# Patient Record
Sex: Female | Born: 2000
Health system: Southern US, Community
[De-identification: ages and names within clinical notes are randomized; demographics above are authoritative.]

## PROBLEM LIST (undated history)

## (undated) DIAGNOSIS — Z9189 Other specified personal risk factors, not elsewhere classified: Secondary | ICD-10-CM

## (undated) DIAGNOSIS — R519 Headache, unspecified: Secondary | ICD-10-CM

## (undated) DIAGNOSIS — T7840XA Allergy, unspecified, initial encounter: Secondary | ICD-10-CM

## (undated) DIAGNOSIS — G43909 Migraine, unspecified, not intractable, without status migrainosus: Secondary | ICD-10-CM

## (undated) DIAGNOSIS — R55 Syncope and collapse: Secondary | ICD-10-CM

## (undated) DIAGNOSIS — R51 Headache: Secondary | ICD-10-CM

## (undated) HISTORY — DX: Syncope and collapse: R55

## (undated) HISTORY — PX: TYMPANOSTOMY TUBE PLACEMENT: SHX32

## (undated) HISTORY — DX: Headache, unspecified: R51.9

## (undated) HISTORY — DX: Other specified personal risk factors, not elsewhere classified: Z91.89

## (undated) HISTORY — DX: Allergy, unspecified, initial encounter: T78.40XA

## (undated) HISTORY — DX: Migraine, unspecified, not intractable, without status migrainosus: G43.909

---
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
## 2018-02-24 ENCOUNTER — Ambulatory Visit: Payer: BLUE CROSS/BLUE SHIELD | Admitting: Neurology

## 2018-03-23 ENCOUNTER — Encounter: Payer: Self-pay | Admitting: Neurology

## 2018-04-13 ENCOUNTER — Ambulatory Visit: Payer: BLUE CROSS/BLUE SHIELD | Admitting: Neurology

## 2018-04-30 ENCOUNTER — Ambulatory Visit: Payer: BLUE CROSS/BLUE SHIELD | Admitting: Neurology

## 2018-05-01 ENCOUNTER — Ambulatory Visit: Payer: BLUE CROSS/BLUE SHIELD | Admitting: Neurology

## 2018-06-10 ENCOUNTER — Ambulatory Visit: Payer: BLUE CROSS/BLUE SHIELD | Admitting: Neurology

## 2018-06-11 ENCOUNTER — Other Ambulatory Visit: Payer: Self-pay

## 2018-06-11 ENCOUNTER — Encounter: Payer: Self-pay | Admitting: Neurology

## 2018-06-11 ENCOUNTER — Ambulatory Visit (INDEPENDENT_AMBULATORY_CARE_PROVIDER_SITE_OTHER): Payer: BLUE CROSS/BLUE SHIELD | Admitting: Neurology

## 2018-06-11 VITALS — BP 94/60 | HR 92 | Ht 60.0 in | Wt 95.0 lb

## 2018-06-11 DIAGNOSIS — M542 Cervicalgia: Secondary | ICD-10-CM

## 2018-06-11 DIAGNOSIS — G43009 Migraine without aura, not intractable, without status migrainosus: Secondary | ICD-10-CM | POA: Diagnosis not present

## 2018-06-11 MED ORDER — TIZANIDINE HCL 2 MG PO TABS: 2.0000 mg | ORAL_TABLET | Freq: Every evening | ORAL | 2 refills | Status: DC | PRN

## 2018-06-11 MED ORDER — RIZATRIPTAN BENZOATE 10 MG PO TABS: ORAL_TABLET | ORAL | 2 refills | Status: DC

## 2018-06-11 NOTE — Patient Instructions (Signed)
Migraine Recommendations: 1.  Continue nortriptyline 10mg  at bedtime.  2.  STOP SUMATRIPTAN.  Instead, take rizatriptan 10mg  at earliest onset of headache.  May repeat dose once in 2 hours if needed.  Do not exceed two tablets in 24 hours. 3.  Limit use of pain relievers to no more than 2 days out of the week.  These medications include acetaminophen, ibuprofen, triptans and narcotics.  This will help reduce risk of rebound headaches. 4.  Be aware of common food triggers such as processed sweets, processed foods with nitrites (such as deli meat, hot dogs, sausages), foods with MSG, alcohol (such as wine), chocolate, certain cheeses, certain fruits (dried fruits, bananas, some citrus fruit), vinegar, diet soda. 4.  Avoid caffeine 5.  Routine exercise 6.  Proper sleep hygiene 7.  Stay adequately hydrated with water 8.  Keep a headache diary. 9.  Maintain proper stress management. 10.  Do not skip meals. 11.  Consider supplements:  Magnesium citrate 400mg  to 600mg  daily, riboflavin 400mg , Coenzyme Q 10 100mg  three times daily 12.  FOR NECK PAIN, TAKE TIZANIDINE 2MG  AT BEDTIME.  MAY TAKE 4MG  IF NEEDED.  CAUTION FOR DROWSINESS 13.  Let me know if you wish for me to refer you to physical therapy or doctor of osteopathic medical doctor at [facility] 14.  Follow up in 6 months.

## 2018-06-11 NOTE — Progress Notes (Signed)
NEUROLOGY FOLLOW UP OFFICE NOTE  Tiffany Barnett [id number]  HISTORY OF PRESENT ILLNESS: Tiffany Barnett is an 17 year old female with neurocardiogenic syncope who follows up for migraines.  She is accompanied by her mother who supplements history.   UPDATE: Overall, headache frequency is low.  In April, she had a two week intractable migraine.  No known triggers.  She was not stressed as it was in the middle of semester so no finals.  May have been weather.  Also, she is allergic to dogs.  Her family recently got a dog, so she had a migraine for a week, likely related to the pet dander.  She reports left sided neck pain and believes it may be contributing to her migraines.  Otherwise, migraines are: Intensity:  5/10 Duration:  4 hours with sumatriptan 50 mg.  Sumatriptan still causes stomach pain). Frequency:  3 to 4 days a month. Frequency of pain relievers:  3 to 4 days a month. Current NSAIDS:  no Current analgesics:  no Current triptans:  sumatriptan 50mg  Current anti-emetic:  no Current muscle relaxants:  no Current anti-anxiolytic:  no Current sleep aide:  no Current Antihypertensive medications:  no Current Antidepressant medications:  nortriptyline 10mg  Current Anticonvulsant medications:  no Current Vitamins/Herbal/Supplements:  no Current Antihistamines/Decongestants:  Xyzal Other therapy:  no OCP:  Loryna   Caffeine:  no Alcohol:  no Smoker:  no Diet:  Does not drink enough water Exercise:  no Depression/anxiety:  no Sleep hygiene:  Poor.  Goes to sleep at 2-3 AM due to not feeling tired.  Does not nap.  More tired because she now has to wake up for work for 8 AM during the holiday and will be having an 8 AM class next semester.   HISTORY: Onset:  17 years old.  Started to increase at age 10. Location:  Unilateral retro-orbital, either side Quality:  Pounding/stabbing/pressure Initial intensity:  5/10 Aura:  no Prodrome:  no Postdrome:  no Associated  symptoms:  Sometimes photophobia.  No nausea, vomiting, phonophobia, osmophobia or visual disturbance.  She has not had any new worse headache of her life, waking up from sleep Initial Duration:  3 to 4 hours (sometimes 2 hours with sumatriptan 25mg ) Initial Frequency:  5 days per month Initial Frequency of abortive medication: 5 days per month Triggers/exacerbating factors:  unknown Relieving factors:  unknown Activity:  Aggravates but still functions.   Past NSAIDS:  Excedrin, ibuprofen Past analgesics:  Tylenol, Excedrin Past abortive triptans:  no Past muscle relaxants:  no Past anti-emetic:  no Past antihypertensive medications:  no Past antidepressant medications:  no Past anticonvulsant medications:  no Past vitamins/Herbal/Supplements:  no Other past therapies:  no   Since childhood, she has had recurrent syncope which has been diagnosed as neurocardiogenic syncope.  She will feel lightheaded with dimming of vision, diaphoresis and palpitations.  She passes out for just a few seconds.  There is no postictal confusion.  It occurs spontaneously and not triggered by change in position or exertion (however one event was associated with injuring her finger).  It first occurred at age 75.  It occurred again at age 11 and then at age 64.  However, it has been occurring more frequent since age 35.  She had an episode last November and another episode in July.  She would experience a headache about 2 days prior to the syncope.  She does not have headache with the syncope.  There is a question about whether the  syncope is related to her migraines.     Family history of headache:  mom   CT of head from 06/03/17 was personally reviewed and revealed no mass lesion or acute intracranial abnormality.  PAST MEDICAL HISTORY: Past Medical History:  Diagnosis Date  . Headache   . Syncope   . Vasovagal syncope     MEDICATIONS: Current Outpatient Medications on File Prior to Visit  Medication Sig  Dispense Refill  . levocetirizine (XYZAL) 5 MG tablet Take 5 mg by mouth every evening.  4  . LORYNA 3-0.02 MG tablet Take 1 tablet by mouth every evening.   6  . nortriptyline (PAMELOR) 10 MG capsule Take 1 capsule (10 mg total) by mouth at bedtime. 30 capsule 5   No current facility-administered medications on file prior to visit.     ALLERGIES: No Known Allergies  FAMILY HISTORY: Family History  Problem Relation Age of Onset  . Migraines Mother   . Anemia Mother   . Migraines Father     SOCIAL HISTORY: Social History   Socioeconomic History  . Marital status: Single    Spouse name: Not on file  . Number of children: Not on file  . Years of education: Not on file  . Highest education level: Not on file  Occupational History  . Not on file  Social Needs  . Financial resource strain: Not on file  . Food insecurity:    Worry: Not on file    Inability: Not on file  . Transportation needs:    Medical: Not on file    Non-medical: Not on file  Tobacco Use  . Smoking status: Never Smoker  . Smokeless tobacco: Never Used  Substance and Sexual Activity  . Alcohol use: No  . Drug use: No  . Sexual activity: Not on file  Lifestyle  . Physical activity:    Days per week: Not on file    Minutes per session: Not on file  . Stress: Not on file  Relationships  . Social connections:    Talks on phone: Not on file    Gets together: Not on file    Attends religious service: Not on file    Active member of club or organization: Not on file    Attends meetings of clubs or organizations: Not on file    Relationship status: Not on file  . Intimate partner violence:    Fear of current or ex partner: Not on file    Emotionally abused: Not on file    Physically abused: Not on file    Forced sexual activity: Not on file  Other Topics Concern  . Not on file  Social History Narrative  . Not on file    REVIEW OF SYSTEMS: Constitutional: No fevers, chills, or sweats, no  generalized fatigue, change in appetite Eyes: No visual changes, double vision, eye pain Ear, nose and throat: No hearing loss, ear pain, nasal congestion, sore throat Cardiovascular: No chest pain, palpitations Respiratory:  No shortness of breath at rest or with exertion, wheezes GastrointestinaI: No nausea, vomiting, diarrhea, abdominal pain, fecal incontinence Genitourinary:  No dysuria, urinary retention or frequency Musculoskeletal:  Neck pain Integumentary: No rash, pruritus, skin lesions Neurological: as above Psychiatric: No depression, insomnia, anxiety Endocrine: No palpitations, fatigue, diaphoresis, mood swings, change in appetite, change in weight, increased thirst Hematologic/Lymphatic:  No purpura, petechiae. Allergic/Immunologic: no itchy/runny eyes, nasal congestion, recent allergic reactions, rashes  PHYSICAL EXAM: Vitals:   06/11/18 0859  BP: 94/60  Pulse: 92  SpO2: 99%   General: No acute distress.  Patient appears well-groomed.  normal body habitus. Head:  Normocephalic/atraumatic Eyes:  Fundi examined but not visualized Neck: supple, left sided paraspinal tenderness, full range of motion Heart:  Regular rate and rhythm Lungs:  Clear to auscultation bilaterally Back: No paraspinal tenderness Neurological Exam: alert and oriented to person, place, and time. Attention span and concentration intact, recent and remote memory intact, fund of knowledge intact.  Speech fluent and not dysarthric, language intact.  CN II-XII intact. Bulk and tone normal, muscle strength 5/5 throughout.  Sensation to light touch  intact.  Deep tendon reflexes 2+ throughout.  Finger to nose testing intact.  Gait normal, Romberg negative.  IMPRESSION: Migraine without aura, without status migrainosus, not intractable Cervicalgia, likely myofasical  PLAN: 1.  Continue nortriptyline 10mg  at bedtime 2.  Stop sumatriptan.  Try rizatriptan 10mg  instead.  If she has adverse reaction, then we  will try a different class of medication. 3.  Tizanidine 2 to 4mg  at bedtime for neck pain.  She may contact me for referral to physical therapy or to see Sports Medicine for [facility]. 4.  Limit pain relievers to no more than 2 days out of week to prevent rebound headache. 5.  Headache diary 6.  Follow up in 6 months.  Shon Millet, DO  CC: Shirlean Mylar, MD

## 2018-08-10 IMAGING — DX DG CHEST 2V
2 series · 2 of 2 positions shown · non-contrast
Comparison: None.

CLINICAL DATA: Dizziness today, chest tightness.

EXAM:
CHEST  2 VIEW

[w chest pa]
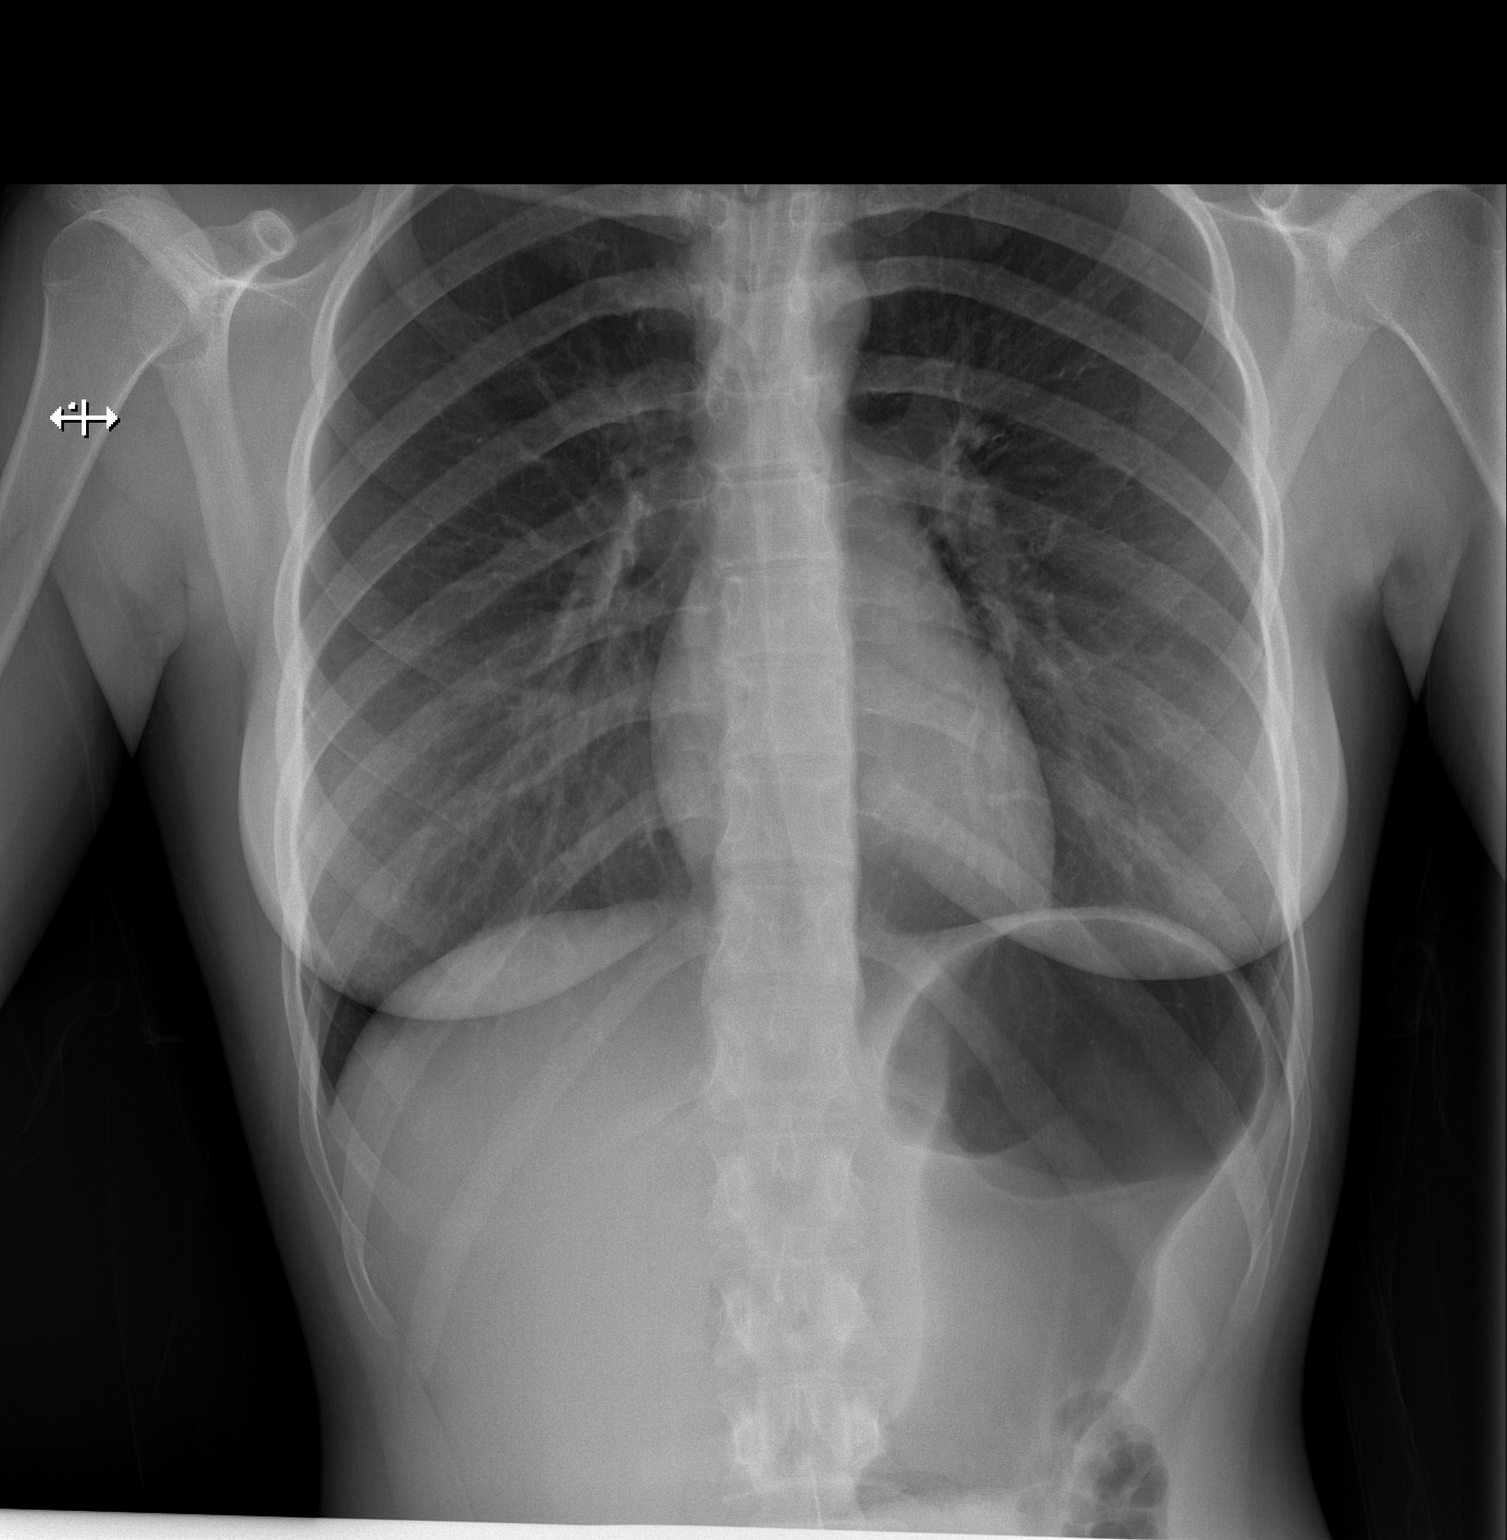

[w chest lat]
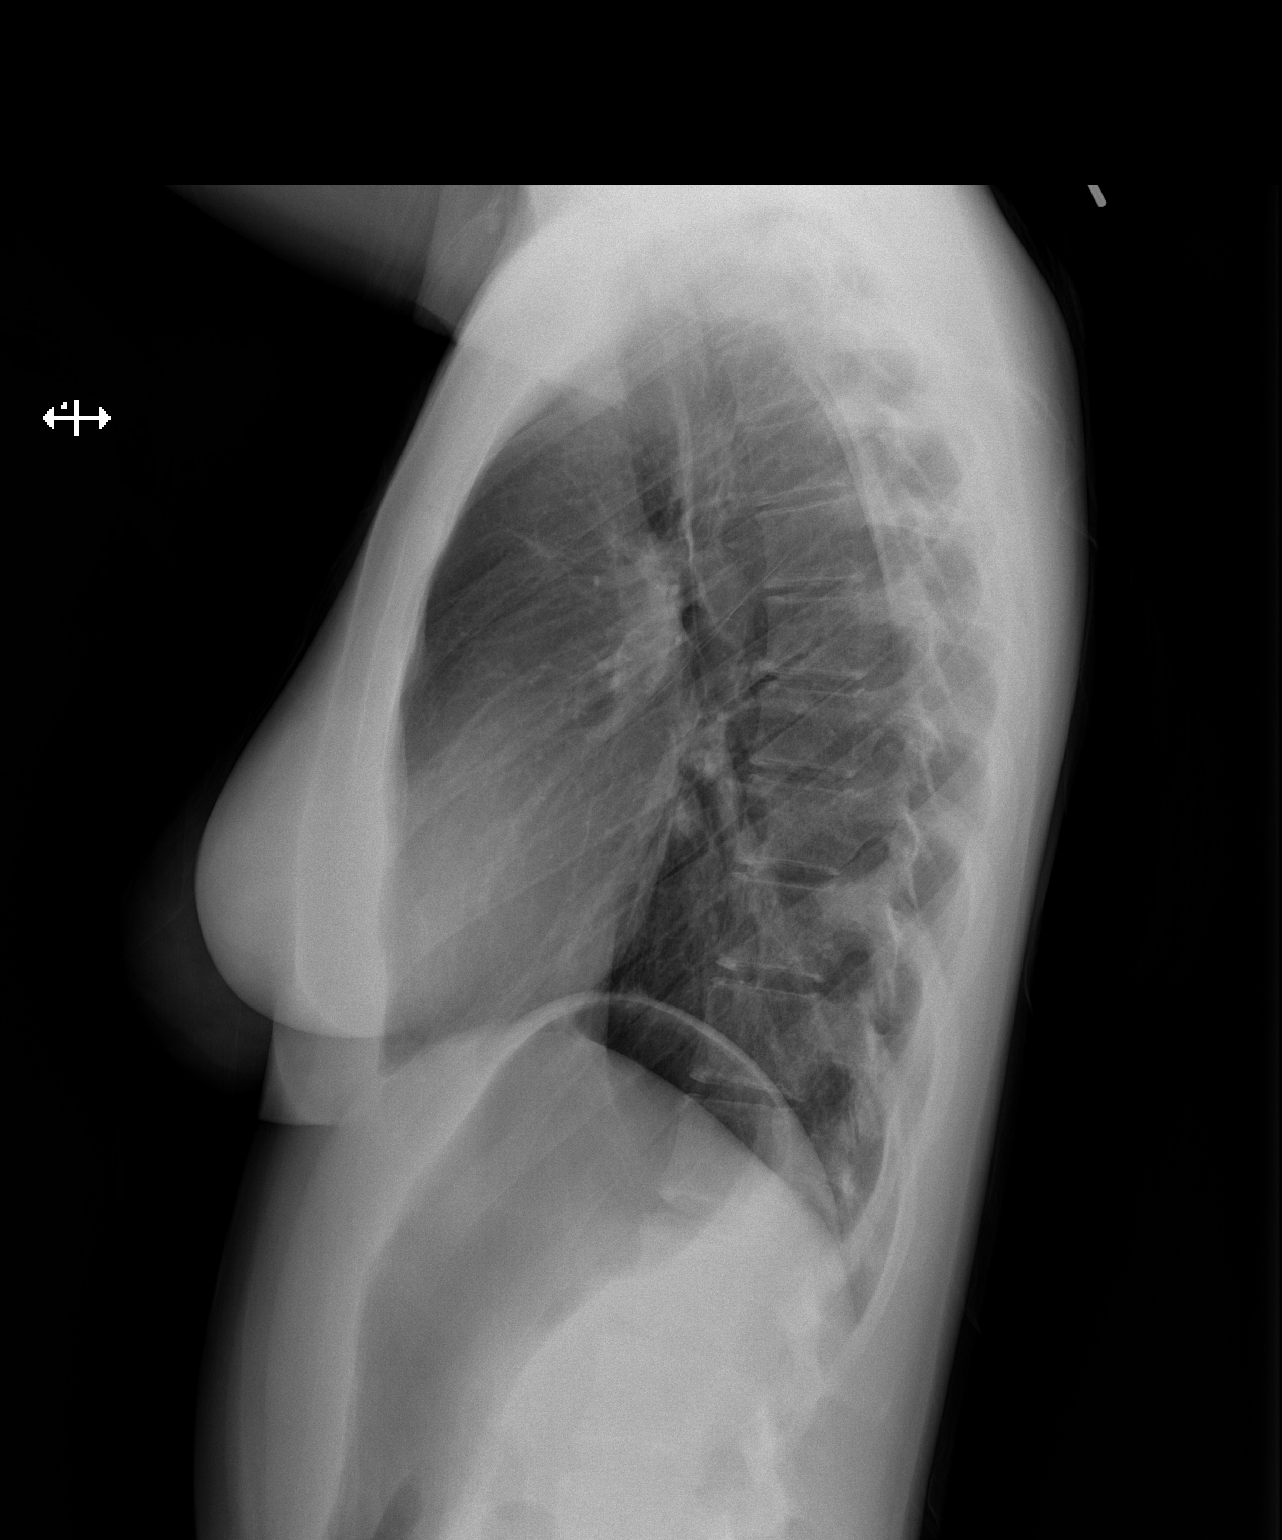

[2 of 2 positions shown; findings below may reference images not displayed]

FINDINGS: The heart size and mediastinal contours are within normal limits.
Both lungs are clear. The visualized skeletal structures are
unremarkable.
IMPRESSION: No active cardiopulmonary disease.

## 2018-08-28 ENCOUNTER — Telehealth: Payer: Self-pay

## 2018-08-28 MED ORDER — NORTRIPTYLINE HCL 25 MG PO CAPS: 25.0000 mg | ORAL_CAPSULE | Freq: Every day | ORAL | 3 refills | Status: DC

## 2018-08-28 NOTE — Telephone Encounter (Signed)
Pt called, she has been having headaches off and on for the last 3 weeks. Pt has been taking nortriptyline 10 mg at bedtime. She has been using rizatriptan, but the headaches have returned. The tizanidine has helped the neck pain.  Would it be appropriate to increase the nortriptyline?

## 2018-08-28 NOTE — Telephone Encounter (Signed)
Called and LMOVM advising Pt. 

## 2018-08-28 NOTE — Telephone Encounter (Signed)
Yes.  Increase nortriptyline to 25mg  at bedtime.  We can increase dose to 50mg  at bedtime in another 4 weeks if needed (she should then contact us).

## 2018-11-25 HISTORY — PX: KNEE SURGERY: SHX244

## 2018-12-13 NOTE — Progress Notes (Signed)
NEUROLOGY FOLLOW UP OFFICE NOTE  Tiffany Barnett [id number]  HISTORY OF PRESENT ILLNESS: Tiffany Barnett is a 18 year old female with neurocardiogenic syncope who follows up for migraines.    UPDATE: Intensity:  6/10 Duration:  1 to 1.5 hours with rizatriptan Frequency:  5 days in past 30 days (4 in past week with change in weather) Frequency of abortive medication: 5 in 30 days Current NSAIDS:  none Current analgesics:  none Current triptans:  Rizatriptan 10mg  Current ergotamine:  none Current anti-emetic:  none Current muscle relaxants:  Tizanidine 2 to 4 mg at bedtime for neck pain Current anti-anxiolytic:  none Current sleep aide:  none Current Antihypertensive medications:  none Current Antidepressant medications:  Nortriptyline 25mg  at bedtime Current Anticonvulsant medications:  none Current anti-CGRP:  none Current Vitamins/Herbal/Supplements:  none Current Antihistamines/Decongestants:  Xyzal Other therapy:  none Hormone/birth control:  Loryna  Caffeine:  no Alcohol:  no Smoker:  no Diet:  Hydrates. Exercise:  Got a new membership for gym. Depression:  no; Anxiety:  bo Other pain:  no Sleep hygiene:  Good.  HISTORY: Onset:  18 years old.  Started to increase at age 55. Location:  Unilateral retro-orbital, either side Quality:  Pounding/stabbing/pressure Initial intensity:  5/10 Aura:  no Prodrome:  no Postdrome:  no Associated symptoms:  Sometimes photophobia.  No nausea, vomiting, phonophobia, osmophobia or visual disturbance.  She has not had any new worse headache of her life, waking up from sleep Initial Duration:  3 to 4 hours (sometimes 2 hours with sumatriptan 25mg ) Initial Frequency:  5 days per month Initial Frequency of abortive medication: 5 days per month Triggers:  Unknown Relieving factors:  unknown Activity:  Aggravates but still functions.  Past NSAIDS:  Excedrin, ibuprofen Past analgesics:  Tylenol, Excedrin Past abortive  triptans:  sumatriptan 50mg  Past muscle relaxants:  no Past anti-emetic:  no Past antihypertensive medications:  no Past antidepressant medications:  no Past anticonvulsant medications:  no Past vitamins/Herbal/Supplements:  no Other past therapies:  no  Since childhood, she has had recurrent syncope which has been diagnosed as neurocardiogenic syncope.  She will feel lightheaded with dimming of vision, diaphoresis and palpitations.  She passes out for just a few seconds.  There is no postictal confusion.  It occurs spontaneously and not triggered by change in position or exertion (however one event was associated with injuring her finger).  It first occurred at age 13.  It occurred again at age 22 and then at age 4.  However, it has been occurring more frequent since age 69.  She had an episode last November and another episode in July.  She would experience a headache about 2 days prior to the syncope.  She does not have headache with the syncope.  There is a question about whether the syncope is related to her migraines.    Family history of headache:  mom  CT of head from 06/03/17 was personally reviewed and revealed no mass lesion or acute intracranial abnormality.  PAST MEDICAL HISTORY: Past Medical History:  Diagnosis Date  . Headache   . Syncope   . Vasovagal syncope     MEDICATIONS: Current Outpatient Medications on File Prior to Visit  Medication Sig Dispense Refill  . doxycycline (VIBRAMYCIN) 100 MG capsule Take by mouth daily.  2  . levocetirizine (XYZAL) 5 MG tablet Take 5 mg by mouth every evening.  4  . LORYNA 3-0.02 MG tablet Take 1 tablet by mouth every evening.   6  .  nortriptyline (PAMELOR) 10 MG capsule Take 1 capsule (10 mg total) by mouth at bedtime. 30 capsule 5  . nortriptyline (PAMELOR) 25 MG capsule Take 1 capsule (25 mg total) by mouth at bedtime. 30 capsule 3  . rizatriptan (MAXALT) 10 MG tablet Take 1 tablet earliest onset of migraine.  May repeat once in  2 hours if needed 10 tablet 2  . tiZANidine (ZANAFLEX) 2 MG tablet Take 1 tablet (2 mg total) by mouth at bedtime as needed for muscle spasms. 30 tablet 2   No current facility-administered medications on file prior to visit.     ALLERGIES: No Known Allergies  FAMILY HISTORY: Family History  Problem Relation Age of Onset  . Migraines Mother   . Anemia Mother   . Migraines Father    SOCIAL HISTORY: Social History   Socioeconomic History  . Marital status: Single    Spouse name: Not on file  . Number of children: Not on file  . Years of education: Not on file  . Highest education level: Not on file  Occupational History  . Not on file  Social Needs  . Financial resource strain: Not on file  . Food insecurity:    Worry: Not on file    Inability: Not on file  . Transportation needs:    Medical: Not on file    Non-medical: Not on file  Tobacco Use  . Smoking status: Never Smoker  . Smokeless tobacco: Never Used  Substance and Sexual Activity  . Alcohol use: No  . Drug use: No  . Sexual activity: Not on file  Lifestyle  . Physical activity:    Days per week: Not on file    Minutes per session: Not on file  . Stress: Not on file  Relationships  . Social connections:    Talks on phone: Not on file    Gets together: Not on file    Attends religious service: Not on file    Active member of club or organization: Not on file    Attends meetings of clubs or organizations: Not on file    Relationship status: Not on file  . Intimate partner violence:    Fear of current or ex partner: Not on file    Emotionally abused: Not on file    Physically abused: Not on file    Forced sexual activity: Not on file  Other Topics Concern  . Not on file  Social History Narrative  . Not on file    REVIEW OF SYSTEMS: Constitutional: No fevers, chills, or sweats, no generalized fatigue, change in appetite Eyes: No visual changes, double vision, eye pain Ear, nose and throat: No  hearing loss, ear pain, nasal congestion, sore throat Cardiovascular: No chest pain, palpitations Respiratory:  No shortness of breath at rest or with exertion, wheezes GastrointestinaI: No nausea, vomiting, diarrhea, abdominal pain, fecal incontinence Genitourinary:  No dysuria, urinary retention or frequency Musculoskeletal:  No neck pain, back pain Integumentary: No rash, pruritus, skin lesions Neurological: as above Psychiatric: No depression, insomnia, anxiety Endocrine: No palpitations, fatigue, diaphoresis, mood swings, change in appetite, change in weight, increased thirst Hematologic/Lymphatic:  No purpura, petechiae. Allergic/Immunologic: no itchy/runny eyes, nasal congestion, recent allergic reactions, rashes  PHYSICAL EXAM: Blood pressure 94/76, pulse 78, height 5' (1.524 m), weight 94 lb (42.6 kg), SpO2 100% General: No acute distress.  Patient appears well-groomed.  normal body habitus. Head:  Normocephalic/atraumatic Eyes:  Fundi examined but not visualized Neck: supple, no paraspinal tenderness, full  range of motion Heart:  Regular rate and rhythm Lungs:  Clear to auscultation bilaterally Back: No paraspinal tenderness Neurological Exam: alert and oriented to person, place, and time. Attention span and concentration intact, recent and remote memory intact, fund of knowledge intact.  Speech fluent and not dysarthric, language intact.  CN II-XII intact. Bulk and tone normal, muscle strength 5/5 throughout.  Sensation to light touch.  Deep tendon reflexes 2+ throughout, toes downgoing.  Finger to nose and heel to shin testing intact.  Gait normal, Romberg negative.  IMPRESSION: 1.  Migraine without aura, without status migrainosus, not intractable 2.  Cervicalgia, likely myofascial  PLAN: 1.  For preventative management, nortriptyline 25mg  at bedtime 2.  For abortive therapy, rizatriptan 10mg  3. Tizanidine at bedtime for neck pain 4.  Limit use of pain relievers to no  more than 2 days out of week to prevent risk of rebound or medication-overuse headache. 5.  Keep headache diary 6.  Exercise, hydration, caffeine cessation, sleep hygiene, monitor for and avoid triggers 7.  Consider:  magnesium citrate 400mg  daily, riboflavin 400mg  daily, and coenzyme Q10 100mg  three times daily 8.   Follow up in 6 months.  Shon Millet, DO  CC:  Shirlean Mylar, MD

## 2018-12-14 ENCOUNTER — Ambulatory Visit (INDEPENDENT_AMBULATORY_CARE_PROVIDER_SITE_OTHER): Payer: BLUE CROSS/BLUE SHIELD | Admitting: Neurology

## 2018-12-14 ENCOUNTER — Encounter: Payer: Self-pay | Admitting: Neurology

## 2018-12-14 VITALS — BP 94/76 | HR 78 | Ht 60.0 in | Wt 94.0 lb

## 2018-12-14 DIAGNOSIS — G43009 Migraine without aura, not intractable, without status migrainosus: Secondary | ICD-10-CM

## 2018-12-14 NOTE — Patient Instructions (Signed)
1.  For preventative management, continue nortriptyline 25mg  at bedtime 2.  For abortive therapy, rizatriptan 10mg  3. Tizanidine at bedtime for neck pain 4.  Limit use of pain relievers to no more than 2 days out of week to prevent risk of rebound or medication-overuse headache. 5.  Keep headache diary 6.  Exercise, hydration, caffeine cessation, sleep hygiene, monitor for and avoid triggers 7.  Consider:  magnesium citrate 400mg  daily, riboflavin 400mg  daily, and coenzyme Q10 100mg  three times daily 8.  Follow up in 6 months

## 2018-12-15 ENCOUNTER — Other Ambulatory Visit: Payer: Self-pay | Admitting: Neurology

## 2019-02-19 ENCOUNTER — Other Ambulatory Visit: Payer: Self-pay | Admitting: Neurology

## 2019-03-13 IMAGING — CT CT HEAD W/O CM
3 of 4 series · 15 of 47 positions shown, 18 images · non-contrast
Comparison: None.

CLINICAL DATA: 16 y/o F; status post fall with head injury and
swelling over the right eye.

EXAM:
CT HEAD WITHOUT CONTRAST
TECHNIQUE: Contiguous axial images were obtained from the base of the skull
through the vertex without intravenous contrast.

[Series 2: head w/o · axial · non-contrast · 0.45mm/px · z∈[-86,+24]mm · 9 of 28 slices shown, 12 images]
[im 3/28  brain]
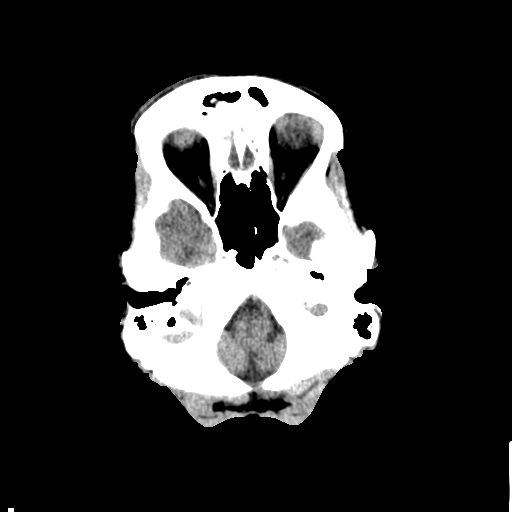
[im 3/28  bone]
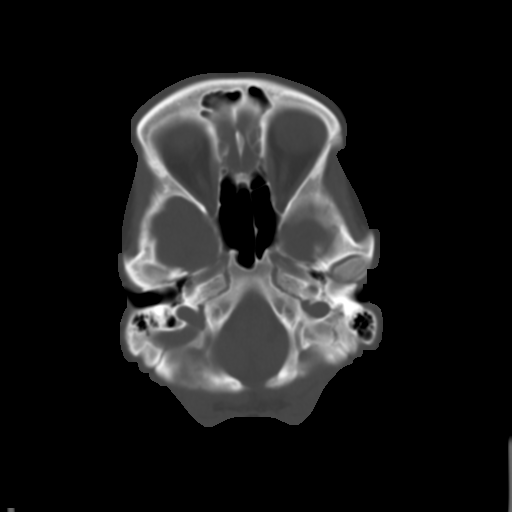
[im 6/28  brain]
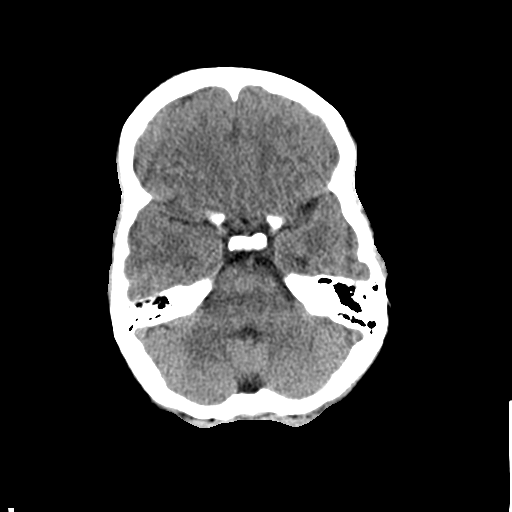
[im 9/28  brain]
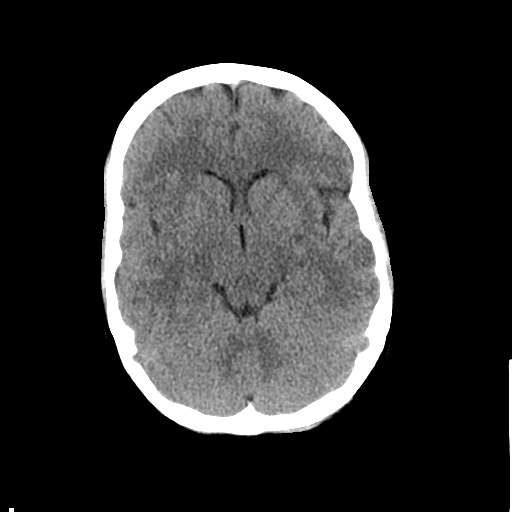
[im 11/28  brain]
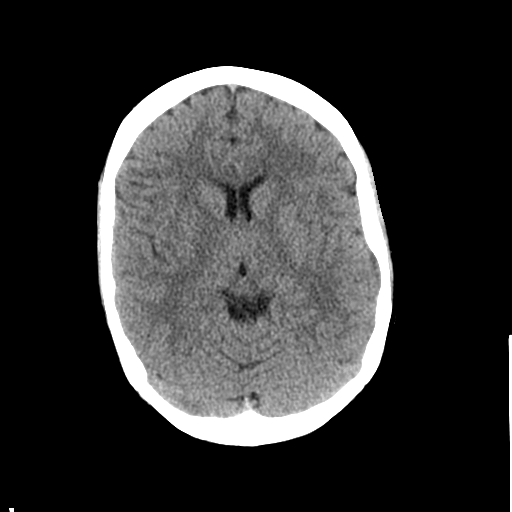
[im 14/28  brain]
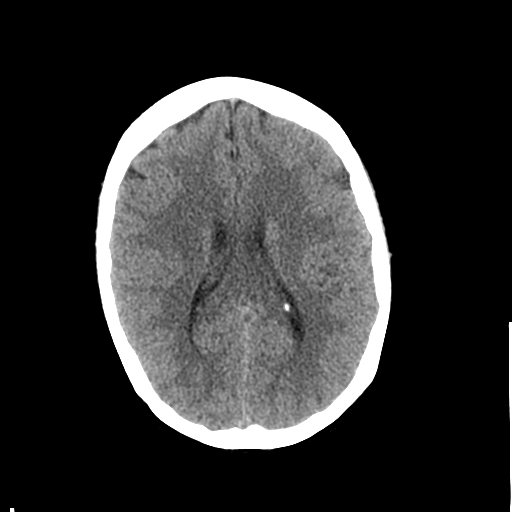
[im 14/28  bone]
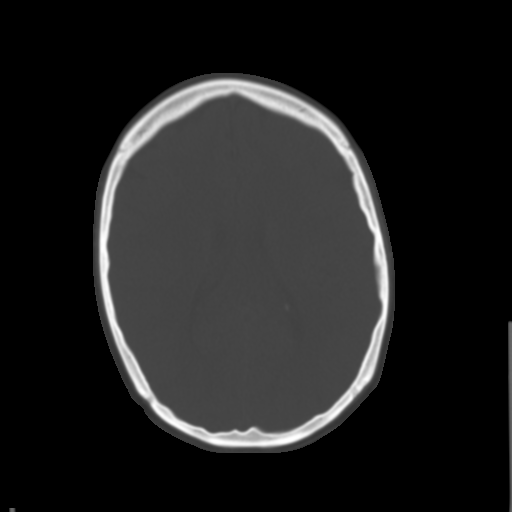
[im 17/28  brain]
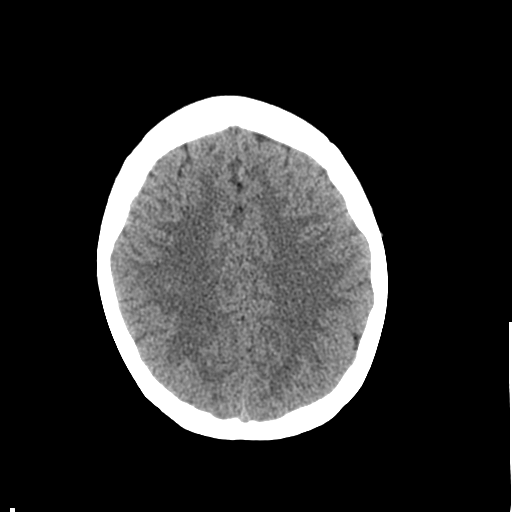
[im 19/28  brain]
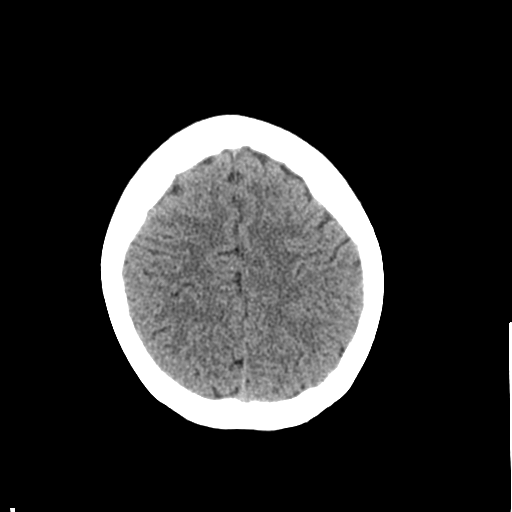
[im 22/28  brain]
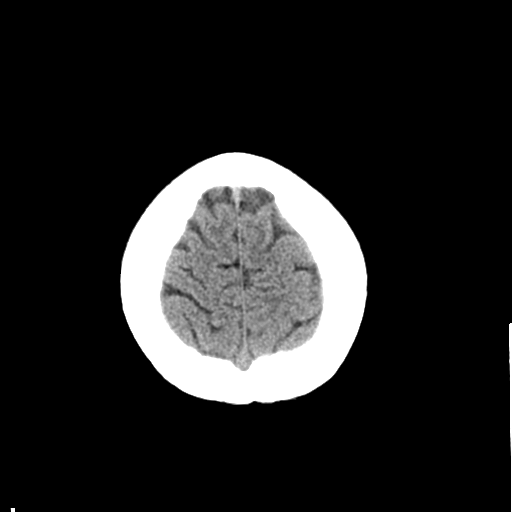
[im 25/28  brain]
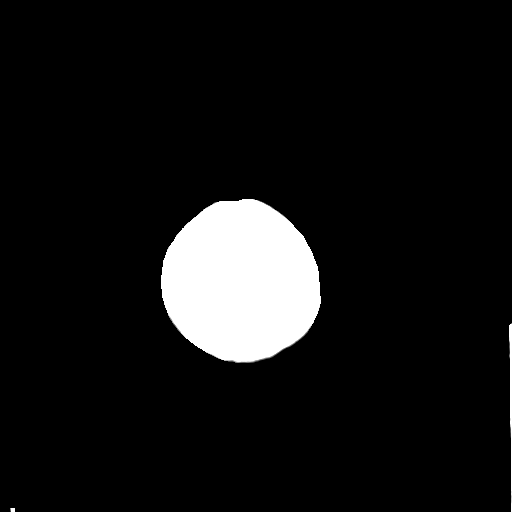
[im 25/28  bone]
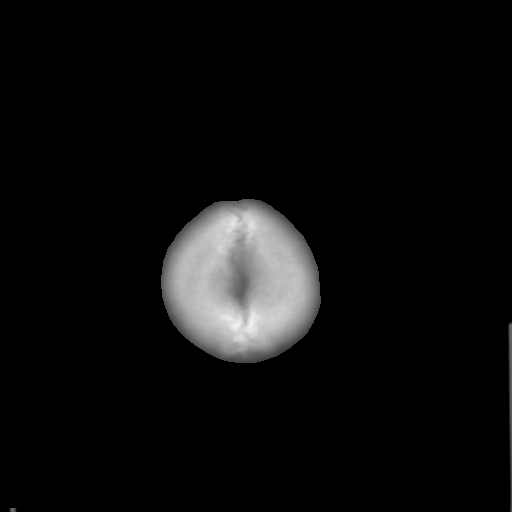

[Series 4: coronal · coronal · 0.26mm/px · 3 of 60 slices shown]
[im 20/60  brain]
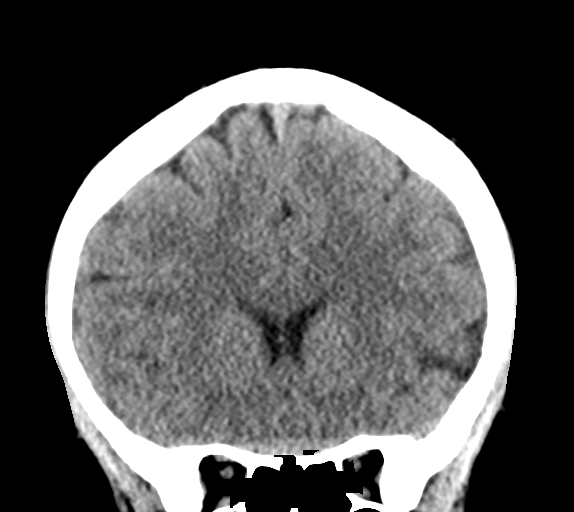
[im 27/60  brain]
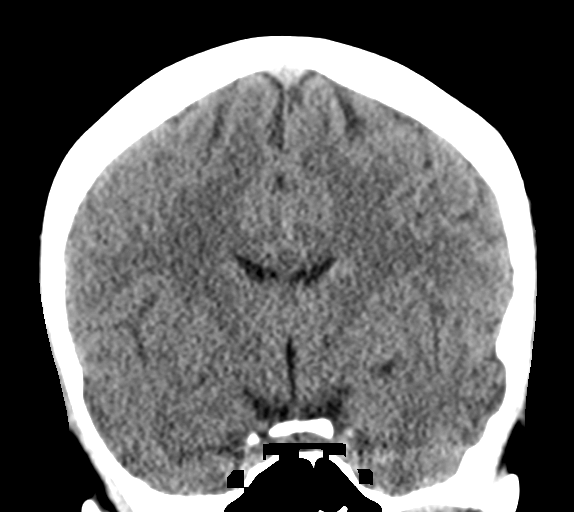
[im 33/60  brain]
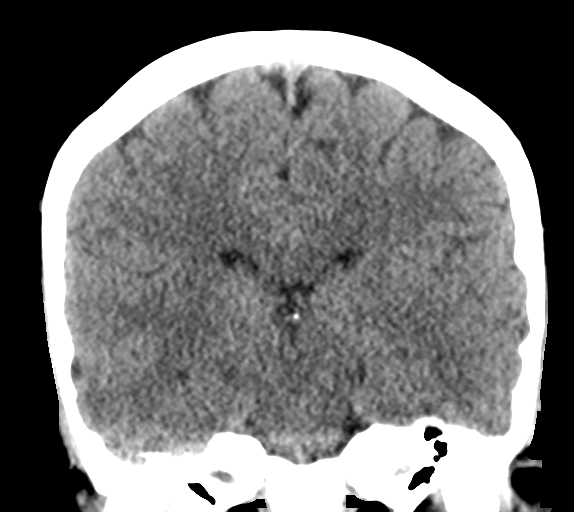

[Series 5: sagittal · sagittal · 0.26mm/px · 3 of 50 slices shown]
[im 17/50  brain]
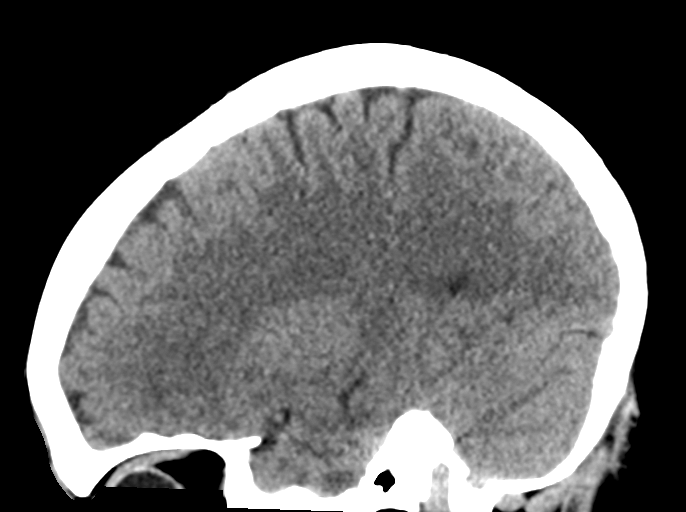
[im 25/50  brain]
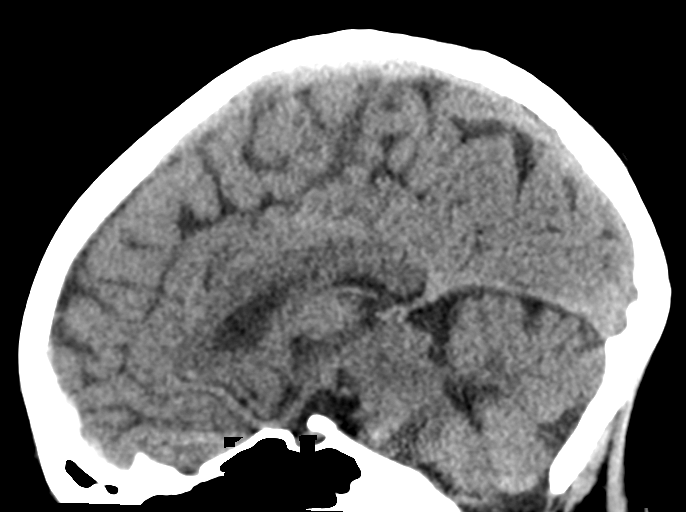
[im 33/50  brain]
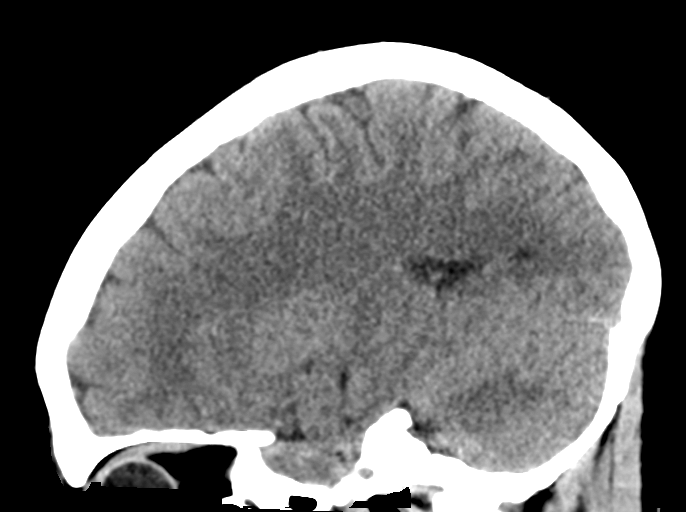

[15 of 47 positions shown; findings below may reference images not displayed]

FINDINGS: Brain: No evidence of acute infarction, hemorrhage, hydrocephalus,
extra-axial collection or mass lesion/mass effect.

Vascular: No hyperdense vessel or unexpected calcification.

Skull: Normal. Negative for fracture or focal lesion.

Sinuses/Orbits: No acute finding.

Other: Soft tissue swelling of the right frontal scalp compatible
with contusion.
IMPRESSION: 1. Small right frontal scalp contusion.  No skull fracture.
2. No acute intracranial abnormality.

By: Morais Dos Londjala M.D.

## 2019-04-21 ENCOUNTER — Other Ambulatory Visit: Payer: Self-pay | Admitting: Neurology

## 2019-04-28 ENCOUNTER — Telehealth: Payer: Self-pay | Admitting: Neurology

## 2019-04-28 NOTE — Telephone Encounter (Signed)
Pt states that she has been experiencing on and off migraines for the past two months. Episodes last for about a week, she wants to know if she can take something to help.

## 2019-04-29 NOTE — Telephone Encounter (Signed)
Called pt to confirm she is still taking the nortriptyline and using the rizatriptan for abortive. Her VM is full, unable to LM.

## 2019-04-30 NOTE — Telephone Encounter (Signed)
Attempted to reach Pt again, VM is full, unable to LM

## 2019-06-02 ENCOUNTER — Encounter: Payer: Self-pay | Admitting: Neurology

## 2019-06-14 ENCOUNTER — Telehealth: Payer: BLUE CROSS/BLUE SHIELD | Admitting: Neurology

## 2019-06-27 NOTE — Progress Notes (Signed)
Virtual Visit via Video Note The purpose of this virtual visit is to provide medical care while limiting exposure to the novel coronavirus.    Consent was obtained for video visit:  Yes Answered questions that patient had about telehealth interaction:  Yes I discussed the limitations, risks, security and privacy concerns of performing an evaluation and management service by telemedicine. I also discussed with the patient that there may be a patient responsible charge related to this service. The patient expressed understanding and agreed to proceed.  Pt location: Home Physician Location: Home Name of referring provider:  Shirlean MylarWebb, Carol, MD I connected with Tiffany Barnett at patients initiation/request on 06/28/2019 at  3:10 PM EDT by video enabled telemedicine application and verified that I am speaking with the correct person using two identifiers. Pt MRN:  [id number] Pt DOB:  01/12/2001 Video Participants:  Tiffany Barnett   History of Present Illness:  Tiffany Barnett is a 18 year old female with neurocardiogenic syncope who follows up for migraines.    UPDATE: She has been having fluctuation in headaches.  One time, she had a migraine where her right eye looked swollen.   Intensity:  6/10 Duration:  1 to 1.5 hours with rizatriptan Frequency:  10 days per month, usually occurring 3 consecutive days in a row.  Frequency of abortive medication: 10 days a month. Current NSAIDS:  none Current analgesics:  none Current triptans:  Rizatriptan 10mg  Current ergotamine:  none Current anti-emetic:  none Current muscle relaxants:  Tizanidine 2 to 4 mg at bedtime for neck pain Current anti-anxiolytic:  none Current sleep aide:  none Current Antihypertensive medications:  none Current Antidepressant medications:  Nortriptyline 25mg  at bedtime Current Anticonvulsant medications:  none Current anti-CGRP:  none Current Vitamins/Herbal/Supplements:  none Current  Antihistamines/Decongestants:  Xyzal Other therapy:  none Hormone/birth control:  NIKKI PO  Caffeine:  no Alcohol:  no Smoker:  no Diet:  Hydrates. Exercise:  Got a new membership for gym. Depression:  no; Anxiety:  bo Other pain:  no Sleep hygiene:  Good.  HISTORY: Onset:  18 years old. Started to increase at age 18. Location: Unilateral retro-orbital, either side Quality: Pounding/stabbing/pressure Initial intensity: 5/10 Aura: no Prodrome: no Postdrome: no Associated symptoms:  Sometimes photophobia. No nausea, vomiting, phonophobia, osmophobia or visual disturbance. She has not had any new worse headache of her life, waking up from sleep Initial Duration: 3 to 4 hours (sometimes 2 hours with sumatriptan 25mg ) Initial Frequency: 5 days per month Initial Frequency of abortive medication: 5 days per month Triggers:  Unknown Relieving factors:  Unknown Activity: Aggravates but still functions.  Past NSAIDS: Excedrin, ibuprofen Past analgesics: Tylenol, Excedrin Past abortive triptans: sumatriptan 50mg  Past muscle relaxants: no Past anti-emetic: no Past antihypertensive medications: no Past antidepressant medications: no Past anticonvulsant medications: no Past vitamins/Herbal/Supplements: no Other past therapies: no  Since childhood, she has had recurrent syncope which has been diagnosed as neurocardiogenic syncope. She will feel lightheaded with dimming of vision, diaphoresis and palpitations. She passes out for just a few seconds. There is no postictal confusion. It occurs spontaneously and not triggered by change in position or exertion (however one event was associated with injuring her finger). It first occurred at age 89. It occurred again at age 18 and then at age 18. However, it has been occurring more frequent since age 18. She had an episode last November and another episode in July. She would experience a headache about 2 days prior  to the syncope. She  does not have headache with the syncope. There is a question about whether the syncope is related to her migraines.   Family history of headache: mom  CT of head from 06/03/17 was personally reviewed and revealed no mass lesion or acute intracranial abnormality.  Past Medical History: Past Medical History:  Diagnosis Date  . Headache   . Syncope   . Vasovagal syncope     Medications: Outpatient Encounter Medications as of 06/28/2019  Medication Sig  . levocetirizine (XYZAL) 5 MG tablet Take 5 mg by mouth every evening.  Marland Kitchen LORYNA 3-0.02 MG tablet Take 1 tablet by mouth every evening.   . nortriptyline (PAMELOR) 25 MG capsule TAKE 1 CAPSULE (25 MG TOTAL) BY MOUTH AT BEDTIME.  . rizatriptan (MAXALT) 10 MG tablet TAKE 1 TABLET EARLIEST ONSET OF MIGRAINE. MAY REPEAT ONCE IN 2 HOURS IF NEEDED  . tiZANidine (ZANAFLEX) 2 MG tablet Take 1 tablet (2 mg total) by mouth at bedtime as needed for muscle spasms.   No facility-administered encounter medications on file as of 06/28/2019.     Allergies: No Known Allergies  Family History: Family History  Problem Relation Age of Onset  . Migraines Mother   . Anemia Mother   . Migraines Father     Social History: Social History   Socioeconomic History  . Marital status: Single    Spouse name: Not on file  . Number of children: Not on file  . Years of education: Not on file  . Highest education level: Not on file  Occupational History  . Not on file  Social Needs  . Financial resource strain: Not on file  . Food insecurity    Worry: Not on file    Inability: Not on file  . Transportation needs    Medical: Not on file    Non-medical: Not on file  Tobacco Use  . Smoking status: Never Smoker  . Smokeless tobacco: Never Used  Substance and Sexual Activity  . Alcohol use: No  . Drug use: No  . Sexual activity: Not on file  Lifestyle  . Physical activity    Days per week: Not on file    Minutes per session:  Not on file  . Stress: Not on file  Relationships  . Social Herbalist on phone: Not on file    Gets together: Not on file    Attends religious service: Not on file    Active member of club or organization: Not on file    Attends meetings of clubs or organizations: Not on file    Relationship status: Not on file  . Intimate partner violence    Fear of current or ex partner: Not on file    Emotionally abused: Not on file    Physically abused: Not on file    Forced sexual activity: Not on file  Other Topics Concern  . Not on file  Social History Narrative  . Not on file    Observations/Objective:   Height 5' (1.524 m), weight 95 lb (43.1 kg). No acute distress.  Alert and oriented.  Speech fluent and not dysarthric.  Language intact.  Eyes orthophoric on primary gaze.  Face symmetric.  Assessment and Plan:   Migraine without aura, without status migrainosus, not intractable  1.  For preventative management, increase nortriptyline to 50mg  at bedtime.  We can increase to 75mg  at bedtime in 6 weeks if needed. 2.  For abortive therapy, rizatriptan 10mg  3.  Limit use  of pain relievers to no more than 2 days out of week to prevent risk of rebound or medication-overuse headache. 4.  Keep headache diary 5.  Exercise, hydration, caffeine cessation, sleep hygiene, monitor for and avoid triggers 6.  Consider:  magnesium citrate 400mg  daily, riboflavin 400mg  daily, and coenzyme Q10 100mg  three times daily 7. Reminded that currently taking a hormone or birth control may be a possible trigger or aggravating factor for migraine. 8. Follow up 4 months.  Follow Up Instructions:    -I discussed the assessment and treatment plan with the patient. The patient was provided an opportunity to ask questions and all were answered. The patient agreed with the plan and demonstrated an understanding of the instructions.   The patient was advised to call back or seek an in-person evaluation if  the symptoms worsen or if the condition fails to improve as anticipated.  Cira ServantAdam Robert , DO

## 2019-06-28 ENCOUNTER — Encounter: Payer: Self-pay | Admitting: Neurology

## 2019-06-28 ENCOUNTER — Other Ambulatory Visit: Payer: Self-pay

## 2019-06-28 ENCOUNTER — Telehealth (INDEPENDENT_AMBULATORY_CARE_PROVIDER_SITE_OTHER): Payer: BLUE CROSS/BLUE SHIELD | Admitting: Neurology

## 2019-06-28 VITALS — Ht 60.0 in | Wt 95.0 lb

## 2019-06-28 DIAGNOSIS — G43009 Migraine without aura, not intractable, without status migrainosus: Secondary | ICD-10-CM

## 2019-06-28 MED ORDER — NORTRIPTYLINE HCL 50 MG PO CAPS: 50.0000 mg | ORAL_CAPSULE | Freq: Every day | ORAL | 3 refills | Status: DC

## 2019-10-25 ENCOUNTER — Other Ambulatory Visit: Payer: Self-pay

## 2019-10-25 DIAGNOSIS — Z20822 Contact with and (suspected) exposure to covid-19: Secondary | ICD-10-CM

## 2019-10-26 LAB — NOVEL CORONAVIRUS, NAA: SARS-CoV-2, NAA: NOT DETECTED

## 2019-11-02 NOTE — Progress Notes (Signed)
Virtual Visit via Video Note The purpose of this virtual visit is to provide medical care while limiting exposure to the novel coronavirus.    Consent was obtained for video visit:  Yes.   Answered questions that patient had about telehealth interaction:  Yes.   I discussed the limitations, risks, security and privacy concerns of performing an evaluation and management service by telemedicine. I also discussed with the patient that there may be a patient responsible charge related to this service. The patient expressed understanding and agreed to proceed.  Pt location: Home Physician Location: office Name of referring provider:  Shirlean MylarWebb, Carol, MD I connected with Levonne HubertKatelyn Larocque at patients initiation/request on 11/03/2019 at  3:30 PM EST by video enabled telemedicine application and verified that I am speaking with the correct person using two identifiers. Pt MRN:  [id number] Pt DOB:  01/12/2001 Video Participants:  Levonne HubertKatelyn Aggarwal   History of Present Illness:  Levonne HubertKatelyn Cost is a 18 year old female with neurocardiogenic syncope who follows up for migraines.   UPDATE: Intensity:  2-4/10 (one was 6/10) Duration:  30 to 60 minutes Frequency:  2 or 3 headaches in past month Frequency of abortive medication:10 days a month. Current NSAIDS:none Current analgesics:none Current triptans:Rizatriptan 10mg  Current ergotamine:none Current anti-emetic:none Current muscle relaxants:Tizanidine 2 to 4 mg at bedtime for neck pain Current anti-anxiolytic:none Current sleep aide:none Current Antihypertensive medications:none Current Antidepressant medications:Nortriptyline50mg  at bedtime Current Anticonvulsant medications:none Current anti-CGRP:none Current Vitamins/Herbal/Supplements:none Current Antihistamines/Decongestants:Xyzal Other therapy:none Hormone/birth control: NIKKI  PO  Caffeine:no Alcohol:no Smoker:no Diet:Hydrates. Exercise:Got a new membership for gym. Depression:no; Anxiety:bo Other pain:no Sleep hygiene:Good.  HISTORY:  Onset: 18 years old.Started to increase at age 18. Location: Unilateral retro-orbital, either side Quality: Pounding/stabbing/pressure Initial intensity: 5/10 Aura: no Prodrome: no Postdrome: no Associated symptoms: Sometimes  photophobiano nausea, vomiting, phonophobia, osmophobia or visual disturbance. She has not had any new worse headache of her life, waking up from sleep Initial Duration: 3 to 4 hours (sometimes 2 hours with sumatriptan 25mg ) Initial Frequency: 5 days per month Initial Frequency of abortive medication: 5 days per month Triggers: Unknown Relieving factors: None Activity: Aggravates but still functions.  Past NSAIDS: Excedrin, ibuprofen Past analgesics: Tylenol, Excedrin Past abortive triptans:sumatriptan 50mg  Past muscle relaxants: no Past anti-emetic: no Past antihypertensive medications: no Past antidepressant medications: no Past anticonvulsant medications: no Past vitamins/Herbal/Supplements: no Other past therapies: no  Since childhood, she has had recurrent syncope which has been diagnosed as neurocardiogenic syncope. She will feel lightheaded with dimming of vision, diaphoresis and palpitations. She passes out for just a few seconds. There is no postictal confusion. It occurs spontaneously and not triggered by change in position or exertion (however one event was associated with injuring her finger). It first occurred at age 89. It occurred again at age 18 and then at age 18. However, it has been occurring more frequent since age 18. She had an episode last November and another episode in July. She would experience a headache about 2 days prior to the syncope. She does not have headache with the syncope. There is a question about whether  the syncope is related to her migraines.   Family history of headache: mom  CT of head from 06/03/17 was personally reviewed and revealed no mass lesion or acute intracranial abnormality.  Past Medical History: Past Medical History:  Diagnosis Date   Headache    Syncope    Vasovagal syncope     Medications: Outpatient Encounter Medications as of 11/03/2019  Medication  Sig   Drospirenone-Ethinyl Estradiol (NIKKI PO) Take 1 tablet by mouth daily.   levocetirizine (XYZAL) 5 MG tablet Take 5 mg by mouth every evening.   nortriptyline (PAMELOR) 50 MG capsule Take 1 capsule (50 mg total) by mouth at bedtime.   rizatriptan (MAXALT) 10 MG tablet TAKE 1 TABLET EARLIEST ONSET OF MIGRAINE. MAY REPEAT ONCE IN 2 HOURS IF NEEDED   tiZANidine (ZANAFLEX) 2 MG tablet Take 1 tablet (2 mg total) by mouth at bedtime as needed for muscle spasms.   No facility-administered encounter medications on file as of 11/03/2019.     Allergies: Allergies  Allergen Reactions   Other     Family History: Family History  Problem Relation Age of Onset   Migraines Mother    Anemia Mother    Migraines Father     Social History: Social History   Socioeconomic History   Marital status: Single    Spouse name: Not on file   Number of children: Not on file   Years of education: Not on file   Highest education level: Some college, no degree  Occupational History   Occupation: Freight forwarder    Comment: Mudlogger strain: Not on file   Food insecurity    Worry: Not on file    Inability: Not on file   Transportation needs    Medical: Not on file    Non-medical: Not on file  Tobacco Use   Smoking status: Never Smoker   Smokeless tobacco: Never Used  Substance and Sexual Activity   Alcohol use: No   Drug use: No   Sexual activity: Not on file  Lifestyle   Physical activity    Days per week: Not on file    Minutes per session: Not on file    Stress: Not on file  Relationships   Social connections    Talks on phone: Not on file    Gets together: Not on file    Attends religious service: Not on file    Active member of club or organization: Not on file    Attends meetings of clubs or organizations: Not on file    Relationship status: Not on file   Intimate partner violence    Fear of current or ex partner: Not on file    Emotionally abused: Not on file    Physically abused: Not on file    Forced sexual activity: Not on file  Other Topics Concern   Not on file  Social History Narrative   Patient is right-handed. She lives with her parents. She drinks tea and soda occasionally. She exercises sporadically.    Observations/Objective:   Height 5' (1.524 m), weight 97 lb (44 kg). No acute distress.  Alert and oriented.  Speech fluent and not dysarthric.  Language intact.  Eyes orthophoric on primary gaze.  Face symmetric.  Assessment and Plan:   Migraine without aura, without status migrainosus, not intractable  1.  For preventative management, nortriptyline 25mg  at bedtime 2.  For abortive therapy, rizatriptan 10mg  3. Tizanidine 2 to 4mg  at bedtime PRN for neck pain. 4.  Limit use of pain relievers to no more than 2 days out of week to prevent risk of rebound or medication-overuse headache. 5.  Keep headache diary 6.  Exercise, hydration, caffeine cessation, sleep hygiene, monitor for and avoid triggers 7.  Consider:  magnesium citrate 400mg  daily, riboflavin 400mg  daily, and coenzyme Q10 100mg  three times daily 8. Follow up  6 months.   Follow Up Instructions:    -I discussed the assessment and treatment plan with the patient. The patient was provided an opportunity to ask questions and all were answered. The patient agreed with the plan and demonstrated an understanding of the instructions.   The patient was advised to call back or seek an in-person evaluation if the symptoms worsen or if the condition fails to  improve as anticipated.      Cira Servant, DO

## 2019-11-03 ENCOUNTER — Encounter: Payer: Self-pay | Admitting: Neurology

## 2019-11-03 ENCOUNTER — Other Ambulatory Visit: Payer: Self-pay

## 2019-11-03 ENCOUNTER — Telehealth (INDEPENDENT_AMBULATORY_CARE_PROVIDER_SITE_OTHER): Payer: BLUE CROSS/BLUE SHIELD | Admitting: Neurology

## 2019-11-03 VITALS — Ht 60.0 in | Wt 97.0 lb

## 2019-11-03 DIAGNOSIS — G43009 Migraine without aura, not intractable, without status migrainosus: Secondary | ICD-10-CM

## 2019-11-03 MED ORDER — TIZANIDINE HCL 2 MG PO TABS: 2.0000 mg | ORAL_TABLET | Freq: Every evening | ORAL | 2 refills | Status: DC | PRN

## 2019-11-20 ENCOUNTER — Other Ambulatory Visit: Payer: Self-pay | Admitting: Neurology

## 2020-01-06 ENCOUNTER — Other Ambulatory Visit: Payer: Self-pay | Admitting: Neurology

## 2020-03-02 ENCOUNTER — Other Ambulatory Visit: Payer: Self-pay | Admitting: Neurology

## 2020-05-03 ENCOUNTER — Telehealth: Payer: BLUE CROSS/BLUE SHIELD | Admitting: Neurology

## 2020-05-03 ENCOUNTER — Other Ambulatory Visit: Payer: Self-pay

## 2020-05-03 NOTE — Progress Notes (Deleted)
Virtual Visit via Video Note The purpose of this virtual visit is to provide medical care while limiting exposure to the novel coronavirus.    Consent was obtained for video visit:  Yes.   Answered questions that patient had about telehealth interaction:  Yes.   I discussed the limitations, risks, security and privacy concerns of performing an evaluation and management service by telemedicine. I also discussed with the patient that there may be a patient responsible charge related to this service. The patient expressed understanding and agreed to proceed.  Pt location: Home Physician Location: office Name of referring provider:  Shirlean Mylar, MD I connected with Tiffany Barnett at patients initiation/request on 05/03/2020 at 10:30 AM EDT by video enabled telemedicine application and verified that I am speaking with the correct person using two identifiers. Pt MRN:  [id number] Pt DOB:  22-Nov-2001 Video Participants:  Tiffany Barnett   History of Present Illness:  Tiffany Barnett is a19year old female with neurocardiogenic syncope who follows up for migraines.  UPDATE: Intensity:  2-4/10 (one was 6/10) Duration:  30 to 60 minutes Frequency:  2 or 3 headaches in past month Frequency of abortive medication:10 days a month. Current NSAIDS:none Current analgesics:none Current triptans:Rizatriptan 10mg  Current ergotamine:none Current anti-emetic:none Current muscle relaxants:Tizanidine 2 to 4 mg at bedtime for neck pain Current anti-anxiolytic:none Current sleep aide:none Current Antihypertensive medications:none Current Antidepressant medications:Nortriptyline50mg  at bedtime Current Anticonvulsant medications:none Current anti-CGRP:none Current Vitamins/Herbal/Supplements:none Current Antihistamines/Decongestants:Xyzal Other therapy:none Hormone/birth control:NIKKI PO  Caffeine:no Alcohol:no Smoker:no Diet:Hydrates. Exercise:Got  a new membership for gym. Depression:no; Anxiety:bo Other pain:no Sleep hygiene:Good.  HISTORY:  Onset: 19 years old.Started to increase at age 1. Location: Unilateral retro-orbital, either side Quality: Pounding/stabbing/pressure Initial intensity: 5/10 Aura: no Prodrome: no Postdrome: no Associated symptoms: Sometimes photophobiano nausea, vomiting, phonophobia, osmophobia or visual disturbance. She has not had any new worse headache of her life, waking up from sleep Initial Duration: 3 to 4 hours (sometimes 2 hours with sumatriptan 25mg ) Initial Frequency: 5 days per month Initial Frequency of abortive medication: 5 days per month Triggers: Unknown Relieving factors: None Activity: Aggravates but still functions.  Past NSAIDS: Excedrin, ibuprofen Past analgesics: Tylenol, Excedrin Past abortive triptans:sumatriptan 50mg  Past muscle relaxants: no Past anti-emetic: no Past antihypertensive medications: no Past antidepressant medications: no Past anticonvulsant medications: no Past vitamins/Herbal/Supplements: no Other past therapies: no  Since childhood, she has had recurrent syncope which has been diagnosed as neurocardiogenic syncope. She will feel lightheaded with dimming of vision, diaphoresis and palpitations. She passes out for just a few seconds. There is no postictal confusion. It occurs spontaneously and not triggered by change in position or exertion (however one event was associated with injuring her finger). It first occurred at age 19. It occurred again at age 19 and then at age 19. However, it has been occurring more frequent since age 19. She had an episode last November and another episode in July. She would experience a headache about 2 days prior to the syncope. She does not have headache with the syncope. There is a question about whether the syncope is related to her migraines.   Family history of headache:  mom  CT of head from 06/03/17 was personally reviewed and revealed no mass lesion or acute intracranial abnormality.  Past Medical History: Past Medical History:  Diagnosis Date  . Headache   . Syncope   . Vasovagal syncope     Medications: Outpatient Encounter Medications as of 05/03/2020  Medication Sig  . Drospirenone-Ethinyl Estradiol (NIKKI  PO) Take 1 tablet by mouth daily.  Marland Kitchen levocetirizine (XYZAL) 5 MG tablet Take 5 mg by mouth every evening.  . nortriptyline (PAMELOR) 50 MG capsule TAKE ONE CAPSULE BY MOUTH EVERY NIGHT AT BEDTIME  . rizatriptan (MAXALT) 10 MG tablet TAKE ONE TABLET BY MOUTH AT ONSET OF HEADACHE; MAY REPEAT ONE TABLET IN 2 HOURS IF NEEDED.  Marland Kitchen tiZANidine (ZANAFLEX) 2 MG tablet Take 1 tablet (2 mg total) by mouth at bedtime as needed for muscle spasms.   No facility-administered encounter medications on file as of 05/03/2020.    Allergies: Allergies  Allergen Reactions  . Other     Family History: Family History  Problem Relation Age of Onset  . Migraines Mother   . Anemia Mother   . Migraines Father     Social History: Social History   Socioeconomic History  . Marital status: Single    Spouse name: Not on file  . Number of children: 0  . Years of education: Not on file  . Highest education level: Some college, no degree  Occupational History  . Occupation: Freight forwarder    Comment: [employer]  Tobacco Use  . Smoking status: Never Smoker  . Smokeless tobacco: Never Used  Substance and Sexual Activity  . Alcohol use: No  . Drug use: No  . Sexual activity: Not on file  Other Topics Concern  . Not on file  Social History Narrative   Patient is right-handed. She is single lives with her parents. She drinks tea and soda occasionally. She exercises sporadically.   Social Determinants of Health   Financial Resource Strain:   . Difficulty of Paying Living Expenses:   Food Insecurity:   . Worried About Charity fundraiser in the Last Year:   .  Arboriculturist in the Last Year:   Transportation Needs:   . Film/video editor (Medical):   Marland Kitchen Lack of Transportation (Non-Medical):   Physical Activity:   . Days of Exercise per Week:   . Minutes of Exercise per Session:   Stress:   . Feeling of Stress :   Social Connections:   . Frequency of Communication with Friends and Family:   . Frequency of Social Gatherings with Friends and Family:   . Attends Religious Services:   . Active Member of Clubs or Organizations:   . Attends Archivist Meetings:   Marland Kitchen Marital Status:   Intimate Partner Violence:   . Fear of Current or Ex-Partner:   . Emotionally Abused:   Marland Kitchen Physically Abused:   . Sexually Abused:     Observations/Objective:   *** No acute distress.  Alert and oriented.  Speech fluent and not dysarthric.  Language intact.  Eyes orthophoric on primary gaze.  Face symmetric.  Assessment and Plan:   Migraine without aura, without status migrainosus, not intractable  1.  For preventative management, nortriptyline 25mg  at bedtime 2.  For abortive therapy, rizatriptan 10mg  3.  For neck pain, tizanidine 2 to 4mg  at bedtime PRN 4.  Limit use of pain relievers to no more than 2 days out of week to prevent risk of rebound or medication-overuse headache. 5.  Keep headache diary 6.  Exercise, hydration, caffeine cessation, sleep hygiene, monitor for and avoid triggers 7.  Follow up ***   Follow Up Instructions:    -I discussed the assessment and treatment plan with the patient. The patient was provided an opportunity to ask questions and all were answered. The patient agreed  with the plan and demonstrated an understanding of the instructions.   The patient was advised to call back or seek an in-person evaluation if the symptoms worsen or if the condition fails to improve as anticipated.   Dudley Major, DO

## 2020-05-10 ENCOUNTER — Other Ambulatory Visit: Payer: Self-pay | Admitting: Neurology

## 2020-07-28 ENCOUNTER — Other Ambulatory Visit: Payer: Self-pay | Admitting: Neurology

## 2020-08-04 NOTE — Progress Notes (Unsigned)
Virtual Visit via Video Note The purpose of this virtual visit is to provide medical care while limiting exposure to the novel coronavirus.    Consent was obtained for video visit:  Yes.   Answered questions that patient had about telehealth interaction:  Yes.   I discussed the limitations, risks, security and privacy concerns of performing an evaluation and management service by telemedicine. I also discussed with the patient that there may be a patient responsible charge related to this service. The patient expressed understanding and agreed to proceed.  Pt location: Home Physician Location: office Name of referring provider:  Shirlean Mylar, MD I connected with Levonne Hubert at patients initiation/request on 08/07/2020 at  3:10 PM EDT by video enabled telemedicine application and verified that I am speaking with the correct person using two identifiers. Pt MRN:  [id number] Pt DOB:  2000/12/08 Video Participants:  Levonne Hubert   History of Present Illness:  Tiffany Barnett is a84year old female with neurocardiogenic syncope who follows up for migraines.   UPDATE: Intensity:  2-4/10 (one was 6/10) Duration:  30 to 60 minutes Frequency:  2 or 3 headaches in past month Frequency of abortive medication:10 days a month. Current NSAIDS:none Current analgesics:none Current triptans:Rizatriptan 10mg  Current ergotamine:none Current anti-emetic:none Current muscle relaxants:Tizanidine 2 to 4 mg at bedtime for neck pain Current anti-anxiolytic:none Current sleep aide:none Current Antihypertensive medications:none Current Antidepressant medications:Nortriptyline50mg  at bedtime Current Anticonvulsant medications:none Current anti-CGRP:none Current Vitamins/Herbal/Supplements:none Current Antihistamines/Decongestants:Xyzal Other therapy:none Hormone/birth control:NIKKI  PO  Caffeine:no Alcohol:no Smoker:no Diet:Hydrates. Exercise:Got a new membership for gym. Depression:no; Anxiety:bo Other pain:no Sleep hygiene:Good.  HISTORY:  Onset: 19 years old.Started to increase at age 41. Location: Unilateral retro-orbital, either side Quality: Pounding/stabbing/pressure Initial intensity: 5/10 Aura: no Prodrome: no Postdrome: no Associated symptoms: Sometimes photophobiano nausea, vomiting, phonophobia, osmophobia or visual disturbance. She has not had any new worse headache of her life, waking up from sleep Initial Duration: 3 to 4 hours (sometimes 2 hours with sumatriptan 25mg ) Initial Frequency: 5 days per month Initial Frequency of abortive medication: 5 days per month Triggers: Unknown Relieving factors: None Activity: Aggravates but still functions.  Past NSAIDS: Excedrin, ibuprofen Past analgesics: Tylenol, Excedrin Past abortive triptans:sumatriptan 50mg  Past muscle relaxants: no Past anti-emetic: no Past antihypertensive medications: no Past antidepressant medications: no Past anticonvulsant medications: no Past vitamins/Herbal/Supplements: no Other past therapies: no  Since childhood, she has had recurrent syncope which has been diagnosed as neurocardiogenic syncope. She will feel lightheaded with dimming of vision, diaphoresis and palpitations. She passes out for just a few seconds. There is no postictal confusion. It occurs spontaneously and not triggered by change in position or exertion (however one event was associated with injuring her finger). It first occurred at age 19. It occurred again at age 70 and then at age 21. However, it has been occurring more frequent since age 70. She had an episode last November and another episode in July. She would experience a headache about 2 days prior to the syncope. She does not have headache with the syncope. There is a question about whether  the syncope is related to her migraines.   Family history of headache: mom  CT of head from 06/03/17 was personally reviewed and revealed no mass lesion or acute intracranial abnormality.  Past Medical History: Past Medical History:  Diagnosis Date  . Headache   . Syncope   . Vasovagal syncope     Medications: Outpatient Encounter Medications as of 08/07/2020  Medication Sig  . Drospirenone-Ethinyl  Estradiol (NIKKI PO) Take 1 tablet by mouth daily.  Marland Kitchen levocetirizine (XYZAL) 5 MG tablet Take 5 mg by mouth every evening.  . nortriptyline (PAMELOR) 50 MG capsule TAKE ONE CAPSULE BY MOUTH EVERY NIGHT AT BEDTIME  . rizatriptan (MAXALT) 10 MG tablet TAKE ONE TABLET BY MOUTH AT ONSET OF HEADACHE; MAY REPEAT ONE TABLET IN 2 HOURS IF NEEDED.  Marland Kitchen tiZANidine (ZANAFLEX) 2 MG tablet Take 1 tablet (2 mg total) by mouth at bedtime as needed for muscle spasms.   No facility-administered encounter medications on file as of 08/07/2020.    Allergies: Allergies  Allergen Reactions  . Other     Family History: Family History  Problem Relation Age of Onset  . Migraines Mother   . Anemia Mother   . Migraines Father     Social History: Social History   Socioeconomic History  . Marital status: Single    Spouse name: Not on file  . Number of children: 0  . Years of education: Not on file  . Highest education level: Some college, no degree  Occupational History  . Occupation: Production designer, theatre/television/film    Comment: [employer]  Tobacco Use  . Smoking status: Never Smoker  . Smokeless tobacco: Never Used  Vaping Use  . Vaping Use: Never used  Substance and Sexual Activity  . Alcohol use: No  . Drug use: No  . Sexual activity: Not on file  Other Topics Concern  . Not on file  Social History Narrative   Patient is right-handed. She is single lives with her parents. She drinks tea and soda occasionally. She exercises sporadically.   Social Determinants of Health   Financial Resource Strain:   .  Difficulty of Paying Living Expenses: Not on file  Food Insecurity:   . Worried About Programme researcher, broadcasting/film/video in the Last Year: Not on file  . Ran Out of Food in the Last Year: Not on file  Transportation Needs:   . Lack of Transportation (Medical): Not on file  . Lack of Transportation (Non-Medical): Not on file  Physical Activity:   . Days of Exercise per Week: Not on file  . Minutes of Exercise per Session: Not on file  Stress:   . Feeling of Stress : Not on file  Social Connections:   . Frequency of Communication with Friends and Family: Not on file  . Frequency of Social Gatherings with Friends and Family: Not on file  . Attends Religious Services: Not on file  . Active Member of Clubs or Organizations: Not on file  . Attends Banker Meetings: Not on file  . Marital Status: Not on file  Intimate Partner Violence:   . Fear of Current or Ex-Partner: Not on file  . Emotionally Abused: Not on file  . Physically Abused: Not on file  . Sexually Abused: Not on file    Observations/Objective:   *** No acute distress.  Alert and oriented.  Speech fluent and not dysarthric.  Language intact.  Eyes orthophoric on primary gaze.  Face symmetric.  Assessment and Plan:   Migraine without aura, without status migrainosus, not intractable  1.  For preventative management, nortriptyline 50mg  at bedtime 2.  For abortive therapy, rizatriptan 10mg  3.  Tizanidine 2mg  to 4mg  at bedtime PRN for myofascial neck pain 4.  Limit use of pain relievers to no more than 2 days out of week to prevent risk of rebound or medication-overuse headache. 5.  Keep headache diary 6.  Exercise, hydration,  caffeine cessation, sleep hygiene, monitor for and avoid triggers 7.  Consider:  magnesium citrate 400mg  daily, riboflavin 400mg  daily, and coenzyme Q10 100mg  three times daily 8.  Follow up ***   Follow Up Instructions:    -I discussed the assessment and treatment plan with the patient. The patient  was provided an opportunity to ask questions and all were answered. The patient agreed with the plan and demonstrated an understanding of the instructions.   The patient was advised to call back or seek an in-person evaluation if the symptoms worsen or if the condition fails to improve as anticipated.   , DO

## 2020-08-07 ENCOUNTER — Telehealth: Payer: BLUE CROSS/BLUE SHIELD | Admitting: Neurology

## 2020-08-07 ENCOUNTER — Other Ambulatory Visit: Payer: Self-pay

## 2020-09-08 ENCOUNTER — Other Ambulatory Visit: Payer: Self-pay | Admitting: Neurology

## 2020-09-08 NOTE — Addendum Note (Signed)
Addended by: Kandice Robinsons T on: 09/08/2020 01:26 PM   Modules accepted: Orders

## 2020-09-20 ENCOUNTER — Other Ambulatory Visit: Payer: Self-pay | Admitting: Neurology

## 2020-09-20 MED ORDER — NORTRIPTYLINE HCL 50 MG PO CAPS: 50.0000 mg | ORAL_CAPSULE | Freq: Every day | ORAL | 2 refills | Status: DC

## 2020-09-20 NOTE — Telephone Encounter (Signed)
Patient's mom called about the patient's nortriptyline 50 MG. She said she is out of the medication and needs refills as soon as possible.  [facility] on [address] and also to [employer]

## 2020-10-01 ENCOUNTER — Other Ambulatory Visit: Payer: Self-pay | Admitting: Neurology

## 2020-10-20 ENCOUNTER — Other Ambulatory Visit: Payer: Self-pay | Admitting: Neurology

## 2020-11-01 ENCOUNTER — Telehealth: Payer: Self-pay

## 2020-11-01 NOTE — Telephone Encounter (Signed)
Message left by pt mother, Pt needs a refill on her medications.    Please advise

## 2020-11-02 MED ORDER — NORTRIPTYLINE HCL 50 MG PO CAPS: 50.0000 mg | ORAL_CAPSULE | Freq: Every day | ORAL | 1 refills | Status: DC

## 2020-11-02 NOTE — Telephone Encounter (Signed)
We can refill if she can be worked-in within the next month as it has been over a year since last visit.

## 2020-11-02 NOTE — Telephone Encounter (Signed)
Please schedule, pt within the next month.   Refill sent in

## 2020-11-02 NOTE — Telephone Encounter (Signed)
Spoke with pt, she is sch for a f/u on 11/10/20.

## 2020-11-09 NOTE — Progress Notes (Signed)
NEUROLOGY FOLLOW UP OFFICE NOTE  Tiffany Barnett [id number]   Subjective:  Tiffany Barnett is a19year old right-handed female with neurocardiogenic syncope who follows up for migraine.  UPDATE: Intensity:  6/10  Duration:  20 minutes Frequency:  2-3 days a month usually at night and occurring the 2 to 3 days prior to her period.   Frequency of abortive medication:10 days a month. Current NSAIDS:none Current analgesics:none Current triptans:Rizatriptan 10mg  Current ergotamine:none Current anti-emetic:none Current muscle relaxants:Tizanidine 2 to 4 mg at bedtime for neck pain Current anti-anxiolytic:none Current sleep aide:none Current Antihypertensive medications:none Current Antidepressant medications:Nortriptyline50mg  at bedtime Current Anticonvulsant medications:none Current anti-CGRP:none Current Vitamins/Herbal/Supplements:none Current Antihistamines/Decongestants:Xyzal Other therapy:none Hormone/birth control:none  She had an episode a couple of weeks ago in which she developed neck pain with photophobia and phonophobia but no headache.  It lasted that night.  She treated with an ice pack and resting in a dark and quiet room.  Caffeine:no Alcohol:no Smoker:no Diet:Hydrates. Exercise:30 minutes 4 to 5 days a week Depression:no; Anxiety:no Other pain:no Sleep hygiene:Good.  HISTORY:  Onset: 19 years old.Started to increase at age 85. Location: Unilateral retro-orbital, either side Quality: Pounding/stabbing/pressure Initial intensity: 5/10 Aura: no Prodrome: no Postdrome: no Associated symptoms: Sometimes photophobiano nausea, vomiting, phonophobia, osmophobia or visual disturbance. She has not had any new worse headache of her life, waking up from sleep Initial Duration: 3 to 4 hours (sometimes 2 hours with sumatriptan 25mg ) Initial Frequency: 5 days per month Initial Frequency of  abortive medication: 5 days per month Triggers: Unknown Relieving factors: None Activity: Aggravates but still functions.  Past NSAIDS: Excedrin, ibuprofen Past analgesics: Tylenol, Excedrin Past abortive triptans:sumatriptan 50mg  Past muscle relaxants: no Past anti-emetic: no Past antihypertensive medications: no Past antidepressant medications: no Past anticonvulsant medications: no Past vitamins/Herbal/Supplements: no Other past therapies: no  Since childhood, she has had recurrent syncope which has been diagnosed as neurocardiogenic syncope. She will feel lightheaded with dimming of vision, diaphoresis and palpitations. She passes out for just a few seconds. There is no postictal confusion. It occurs spontaneously and not triggered by change in position or exertion (however one event was associated with injuring her finger). It first occurred at age 19. It occurred again at age 19 and then at age 62. However, it has been occurring more frequent since age 19. She had an episode last November and another episode in July. She would experience a headache about 2 days prior to the syncope. She does not have headache with the syncope. There is a question about whether the syncope is related to her migraines.   Family history of headache: mom  CT of head from 06/03/17 was personally reviewed and revealed no mass lesion or acute intracranial abnormality.  PAST MEDICAL HISTORY: Past Medical History:  Diagnosis Date  . Headache   . Syncope   . Vasovagal syncope     MEDICATIONS: Current Outpatient Medications on File Prior to Visit  Medication Sig Dispense Refill  . Drospirenone-Ethinyl Estradiol (NIKKI PO) Take 1 tablet by mouth daily.    December levocetirizine (XYZAL) 5 MG tablet Take 5 mg by mouth every evening.  4  . nortriptyline (PAMELOR) 50 MG capsule Take 1 capsule (50 mg total) by mouth at bedtime. 30 capsule 1  . rizatriptan (MAXALT) 10 MG tablet TAKE ONE  TABLET BY MOUTH AT ONSET OF HEADACHE; MAY REPEAT ONE TABLET IN 2 HOURS IF NEEDED. 10 tablet 3  . tiZANidine (ZANAFLEX) 2 MG tablet Take 1 tablet (2 mg total) by mouth  at bedtime as needed for muscle spasms. 30 tablet 2   No current facility-administered medications on file prior to visit.    ALLERGIES: Allergies  Allergen Reactions  . Other     FAMILY HISTORY: Family History  Problem Relation Age of Onset  . Migraines Mother   . Anemia Mother   . Migraines Father     SOCIAL HISTORY: Social History   Socioeconomic History  . Marital status: Single    Spouse name: Not on file  . Number of children: 0  . Years of education: Not on file  . Highest education level: Some college, no degree  Occupational History  . Occupation: Production designer, theatre/television/film    Comment: [employer]  Tobacco Use  . Smoking status: Never Smoker  . Smokeless tobacco: Never Used  Vaping Use  . Vaping Use: Never used  Substance and Sexual Activity  . Alcohol use: No  . Drug use: No  . Sexual activity: Not on file  Other Topics Concern  . Not on file  Social History Narrative   Patient is right-handed. She is single lives with her parents. She drinks tea and soda occasionally. She exercises sporadically.   Social Determinants of Health   Financial Resource Strain: Not on file  Food Insecurity: Not on file  Transportation Needs: Not on file  Physical Activity: Not on file  Stress: Not on file  Social Connections: Not on file  Intimate Partner Violence: Not on file     Objective:  Blood pressure 100/82, pulse 98, resp. rate 18, height 5' (1.524 m), weight 105 lb (47.6 kg), SpO2 98 %. General: No acute distress.  Patient appears well-groomed.   Head:  Normocephalic/atraumatic Eyes:  Fundi examined but not visualized Neck: supple, no paraspinal tenderness, full range of motion Heart:  Regular rate and rhythm Lungs:  Clear to auscultation bilaterally Back: No paraspinal tenderness Neurological Exam: alert and  oriented to person, place, and time. Attention span and concentration intact, recent and remote memory intact, fund of knowledge intact.  Speech fluent and not dysarthric, language intact.  CN II-XII intact. Bulk and tone normal, muscle strength 5/5 throughout.  Sensation to light touch, temperature and vibration intact.  Deep tendon reflexes 2+ throughout, toes downgoing.  Finger to nose and heel to shin testing intact.  Gait normal, Romberg negative.   Assessment/Plan:   Menstrual migraine, without status migrainosus, not intractable   Episode a couple of weeks ago likely migraine  1.  Migraine prevention:  Nortriptyline 50mg  at bedtime.  I will have her take 75mg  at bedtime daily for the week prior to her period. 2.  Migraine rescue:  Rizatriptan 10mg  3.  Neck pain management:  Tizanidine 2 to 4mg  at bedtime PRN 4.  Limit use of pain relievers to no more than 2 days out of week to prevent risk of rebound or medication-overuse headache. 5.  Keep headache diary 6.  Follow up one year  , DO  CC: , MD

## 2020-11-10 ENCOUNTER — Encounter: Payer: Self-pay | Admitting: Neurology

## 2020-11-10 ENCOUNTER — Ambulatory Visit (INDEPENDENT_AMBULATORY_CARE_PROVIDER_SITE_OTHER): Payer: BC Managed Care – PPO | Admitting: Neurology

## 2020-11-10 ENCOUNTER — Other Ambulatory Visit: Payer: Self-pay

## 2020-11-10 VITALS — BP 100/82 | HR 98 | Resp 18 | Ht 60.0 in | Wt 105.0 lb

## 2020-11-10 DIAGNOSIS — G43829 Menstrual migraine, not intractable, without status migrainosus: Secondary | ICD-10-CM

## 2020-11-10 MED ORDER — NORTRIPTYLINE HCL 50 MG PO CAPS: ORAL_CAPSULE | ORAL | 5 refills | Status: DC

## 2020-11-10 MED ORDER — RIZATRIPTAN BENZOATE 10 MG PO TABS: ORAL_TABLET | ORAL | 5 refills | Status: DC

## 2020-11-10 NOTE — Patient Instructions (Signed)
1. Continue nortriptyline 50mg  tablet.  Take 1 tablet at bedtime except take 1 1/2 tablets at bedtime for the week prior to your period. 2.  Use rizatriptan as needed/directed. 3.  Limit use of pain relievers to no more than 2 days out of week to prevent risk of rebound or medication-overuse headache. 4.  Keep headache diary 5.  Follow up one year

## 2020-12-18 DIAGNOSIS — J3089 Other allergic rhinitis: Secondary | ICD-10-CM | POA: Diagnosis not present

## 2020-12-18 DIAGNOSIS — J3081 Allergic rhinitis due to animal (cat) (dog) hair and dander: Secondary | ICD-10-CM | POA: Diagnosis not present

## 2020-12-18 DIAGNOSIS — H1045 Other chronic allergic conjunctivitis: Secondary | ICD-10-CM | POA: Diagnosis not present

## 2020-12-25 DIAGNOSIS — Z20822 Contact with and (suspected) exposure to covid-19: Secondary | ICD-10-CM | POA: Diagnosis not present

## 2021-02-28 DIAGNOSIS — E785 Hyperlipidemia, unspecified: Secondary | ICD-10-CM | POA: Diagnosis not present

## 2021-02-28 DIAGNOSIS — R5382 Chronic fatigue, unspecified: Secondary | ICD-10-CM | POA: Diagnosis not present

## 2021-02-28 DIAGNOSIS — E538 Deficiency of other specified B group vitamins: Secondary | ICD-10-CM | POA: Diagnosis not present

## 2021-02-28 DIAGNOSIS — E611 Iron deficiency: Secondary | ICD-10-CM | POA: Diagnosis not present

## 2021-02-28 DIAGNOSIS — Z0000 Encounter for general adult medical examination without abnormal findings: Secondary | ICD-10-CM | POA: Diagnosis not present

## 2021-08-29 ENCOUNTER — Encounter: Payer: Self-pay | Admitting: Neurology

## 2021-09-21 ENCOUNTER — Other Ambulatory Visit: Payer: Self-pay | Admitting: Neurology

## 2021-09-28 ENCOUNTER — Other Ambulatory Visit: Payer: Self-pay

## 2021-09-28 ENCOUNTER — Ambulatory Visit (INDEPENDENT_AMBULATORY_CARE_PROVIDER_SITE_OTHER): Payer: BC Managed Care – PPO | Admitting: Physician Assistant

## 2021-09-28 ENCOUNTER — Encounter: Payer: Self-pay | Admitting: Physician Assistant

## 2021-09-28 VITALS — BP 108/78 | HR 82 | Resp 18 | Ht 60.0 in | Wt 110.0 lb

## 2021-09-28 DIAGNOSIS — G43829 Menstrual migraine, not intractable, without status migrainosus: Secondary | ICD-10-CM

## 2021-09-28 MED ORDER — NORTRIPTYLINE HCL 50 MG PO CAPS: ORAL_CAPSULE | ORAL | 5 refills | Status: DC

## 2021-09-28 MED ORDER — NORTRIPTYLINE HCL 25 MG PO CAPS: ORAL_CAPSULE | ORAL | 5 refills | Status: DC

## 2021-09-28 NOTE — Patient Instructions (Signed)
1. Continue nortriptyline 50 mg tablet.  Take 50 tablet at bedtime except take  50+25  at bedtime for the week prior to your period. 2.  Use rizatriptan as needed/directed. 3.  Limit use of pain relievers to no more than 2 days out of week to prevent risk of rebound or medication-overuse headache. 4.  Keep headache diary 5.  Follow up one year

## 2021-09-28 NOTE — Progress Notes (Signed)
NEUROLOGY FOLLOW UP OFFICE NOTE  Murtis Ten [id number]   Subjective:  Tiffany Barnett is a 20 year old right-handed female with neurocardiogenic syncope who follows up for migraine.  UPDATE: Intensity: 6-7/10  Duration:  20 minutes Frequency:  2-3 days a month usually at night and occurring the 2 to 3 days prior to her period.   LMP  started Oct 12 ended on 10/17.   Frequency of abortive medication: 2-3  days a month. Current NSAIDS:  none Current analgesics:  none Current triptans:  Rizatriptan 10mg  Current ergotamine:  none Current anti-emetic:  none Current muscle relaxants: none  Current anti-anxiolytic:  none Current sleep aide:  none Current Antihypertensive medications:  none Current Antidepressant medications:  Nortriptyline 50 mg at bedtime, needs a Rx for  capsule of 25 mg for the days when 75 mg are needed  Current Anticonvulsant medications:  none Current anti-CGRP:  none Current Vitamins/Herbal/Supplements:  none Current Antihistamines/Decongestants:  Xyzal Other therapy:  none Hormone/birth control:  none If she misses a dose prior to her period she develops a mis frontal headache extending towards the neck . She treats with an ice pack and resting in a dark and quiet room.   Caffeine:  no Alcohol:  no Smoker:  no Diet:  Hydrates. Exercise:  30 minutes 3 days a week Depression:  no; Anxiety:  no Other pain:  no Sleep hygiene:  Good.   HISTORY:  Onset: 20 years old.  Started to increase at age 27. Location:  Unilateral retro-orbital, either side Quality:  Pounding/stabbing/pressure Initial intensity:  5/10 Aura:  no Prodrome:  no Postdrome:  no Associated symptoms:  Sometimes  photophobia no nausea, vomiting, phonophobia, osmophobia or visual disturbance.  She has not had any new worse headache of her life, waking up from sleep Initial Duration:  3 to 4 hours (sometimes 2 hours with sumatriptan 25mg ) Initial Frequency:  5 days per  month Initial Frequency of abortive medication: 5 days per month Triggers: Unknown Relieving factors: None Activity:  Aggravates but still functions.   Past NSAIDS:  Excedrin, ibuprofen Past analgesics:  Tylenol, Excedrin Past abortive triptans:  sumatriptan 50mg  Past muscle relaxants:  no Past anti-emetic:  no Past antihypertensive medications:  no Past antidepressant medications:  no Past anticonvulsant medications:  no Past vitamins/Herbal/Supplements:  no Other past therapies:  no   Since childhood, she has had recurrent syncope which has been diagnosed as neurocardiogenic syncope.  She will feel lightheaded with dimming of vision, diaphoresis and palpitations.  She passes out for just a few seconds.  There is no postictal confusion.  It occurs spontaneously and not triggered by change in position or exertion (however one event was associated with injuring her finger).  It first occurred at age 67.  It occurred again at age 57 and then at age 35.  However, it has been occurring more frequent since age 45.  She had an episode last November and another episode in July.  She would experience a headache about 2 days prior to the syncope.  She does not have headache with the syncope.  There is a question about whether the syncope is related to her migraines.     Family history of headache:  mom   CT of head from 06/03/17 was personally reviewed and revealed no mass lesion or acute intracranial abnormality.  PAST MEDICAL HISTORY: Past Medical History:  Diagnosis Date   Headache    Syncope    Vasovagal syncope  MEDICATIONS: Current Outpatient Medications on File Prior to Visit  Medication Sig Dispense Refill   levocetirizine (XYZAL) 5 MG tablet Take 5 mg by mouth every evening.  4   rizatriptan (MAXALT) 10 MG tablet TAKE ONE TABLET BY MOUTH AT ONSET OF HEADACHE; MAY REPEAT ONE TABLET IN 2 HOURS IF NEEDED. 10 tablet 0   Drospirenone-Ethinyl Estradiol (NIKKI PO) Take 1 tablet by mouth  daily. (Patient not taking: Reported on 11/10/2020)     tiZANidine (ZANAFLEX) 2 MG tablet Take 1 tablet (2 mg total) by mouth at bedtime as needed for muscle spasms. 30 tablet 2   No current facility-administered medications on file prior to visit.    ALLERGIES: Allergies  Allergen Reactions   Other     FAMILY HISTORY: Family History  Problem Relation Age of Onset   Migraines Mother    Anemia Mother    Migraines Father     SOCIAL HISTORY: Social History   Socioeconomic History   Marital status: Single    Spouse name: Not on file   Number of children: 0   Years of education: Not on file   Highest education level: Some college, no degree  Occupational History   Occupation: Production designer, theatre/television/film    Comment: [employer]  Tobacco Use   Smoking status: Never   Smokeless tobacco: Never  Vaping Use   Vaping Use: Never used  Substance and Sexual Activity   Alcohol use: Yes   Drug use: No   Sexual activity: Not on file  Other Topics Concern   Not on file  Social History Narrative   Patient is right-handed. She is single lives with her parents. She drinks tea and soda occasionally. She exercises sporadically.   Social Determinants of Health   Financial Resource Strain: Not on file  Food Insecurity: Not on file  Transportation Needs: Not on file  Physical Activity: Not on file  Stress: Not on file  Social Connections: Not on file  Intimate Partner Violence: Not on file     Objective:  Blood pressure 100/82, pulse 98, resp. rate 18, height 5' (1.524 m), weight 105 lb (47.6 kg), SpO2 98 %. General: No acute distress.  Patient appears well-groomed.   Head:  Normocephalic/atraumatic Eyes:  Fundi examined but not visualized Neck: supple, no paraspinal tenderness, full range of motion Heart:  Regular rate and rhythm Lungs:  Clear to auscultation bilaterally Back: No paraspinal tenderness Neurological Exam: alert and oriented to person, place, and time. Attention span and  concentration intact, recent and remote memory intact, fund of knowledge intact.  Speech fluent and not dysarthric, language intact.  CN II-XII intact. Bulk and tone normal, muscle strength 5/5 throughout.  Sensation to light touch, temperature and vibration intact.  Deep tendon reflexes 2+ throughout, toes downgoing.  Finger to nose and heel to shin testing intact.  Gait normal, Romberg negative.   Assessment/Plan:   Menstrual migraine, without status migrainosus, not intractable   Episode a couple of weeks ago likely migraine  1.  Migraine prevention:  Nortriptyline 50mg  at bedtime.  Take 75mg  at bedtime daily for the week prior to her period. Prescription for Nortriptyline 25 mg at bedtime given as capsules cannot be cut  2.  Migraine rescue:  Rizatriptan 10mg  3.  Neck pain management:  Tizanidine 2 to 4mg  at bedtime PRN 4.  Limit use of pain relievers to no more than 2 days out of week to prevent risk of rebound or medication-overuse headache. 5.  Keep headache diary 6.  Follow up one year  Metta Clines, DO  CC: Maurice Small, MD

## 2021-11-13 ENCOUNTER — Ambulatory Visit: Payer: BC Managed Care – PPO | Admitting: Neurology

## 2021-11-24 ENCOUNTER — Other Ambulatory Visit: Payer: Self-pay | Admitting: Neurology

## 2021-12-03 DIAGNOSIS — H1045 Other chronic allergic conjunctivitis: Secondary | ICD-10-CM | POA: Diagnosis not present

## 2021-12-03 DIAGNOSIS — J3089 Other allergic rhinitis: Secondary | ICD-10-CM | POA: Diagnosis not present

## 2021-12-03 DIAGNOSIS — J3081 Allergic rhinitis due to animal (cat) (dog) hair and dander: Secondary | ICD-10-CM | POA: Diagnosis not present

## 2021-12-28 ENCOUNTER — Other Ambulatory Visit: Payer: Self-pay | Admitting: Neurology

## 2022-03-13 DIAGNOSIS — L700 Acne vulgaris: Secondary | ICD-10-CM | POA: Diagnosis not present

## 2022-03-29 ENCOUNTER — Other Ambulatory Visit: Payer: Self-pay | Admitting: Neurology

## 2022-03-29 ENCOUNTER — Telehealth: Payer: Self-pay | Admitting: Neurology

## 2022-03-29 MED ORDER — RIZATRIPTAN BENZOATE 10 MG PO TABS: ORAL_TABLET | ORAL | 5 refills | Status: DC

## 2022-03-29 NOTE — Telephone Encounter (Signed)
The following message was left with [employer] on 03/29/22 at 12:33 PM. ? ?Caller states she is going on a trip and would like a refill on her rizatriptan. ? ?

## 2022-03-29 NOTE — Telephone Encounter (Signed)
done

## 2022-06-12 DIAGNOSIS — L700 Acne vulgaris: Secondary | ICD-10-CM | POA: Diagnosis not present

## 2022-08-12 ENCOUNTER — Telehealth: Payer: Self-pay | Admitting: Neurology

## 2022-08-12 NOTE — Telephone Encounter (Signed)
Pt needs a refill on her nortriptyline 25mg ,  New pharmacy-[facility] on [address] ([address] )

## 2022-09-20 ENCOUNTER — Telehealth: Payer: Self-pay | Admitting: Neurology

## 2022-09-20 ENCOUNTER — Other Ambulatory Visit: Payer: Self-pay | Admitting: Neurology

## 2022-09-20 NOTE — Telephone Encounter (Signed)
1. Which medications need refilled? (List name and dosage, if known) NORTRIPTYLINE 25 MG  2. Which pharmacy/location is medication to be sent to? (include street and city if local pharmacy) [place]  3. Do they need a 30 day or 90 day supply? 90 if possible  Patient is out of the medication.

## 2022-09-20 NOTE — Telephone Encounter (Signed)
LMOVM for patient need correct dosing.

## 2022-09-23 ENCOUNTER — Other Ambulatory Visit: Payer: Self-pay | Admitting: Neurology

## 2022-09-23 MED ORDER — NORTRIPTYLINE HCL 50 MG PO CAPS: ORAL_CAPSULE | ORAL | 0 refills | Status: DC

## 2022-09-23 MED ORDER — NORTRIPTYLINE HCL 25 MG PO CAPS: ORAL_CAPSULE | ORAL | 0 refills | Status: DC

## 2022-09-23 NOTE — Telephone Encounter (Signed)
Patient returned call and said she was taking 25 mg and wants to go back to 50 mg on her nortirtyline.  She'd like refills for both dosages sent in.

## 2022-09-23 NOTE — Progress Notes (Unsigned)
NEUROLOGY FOLLOW UP OFFICE NOTE  Tiffany Barnett [id number]  Assessment/Plan:   Menstrual migraine  Migraine prevention:  nortriptyline 50mg  at bedtime, 75mg  (take with 25mg  capsule) for 7 days starting 2-3 days prior to start of menses. Migraine rescue:  rizatriptan 10mg  Limit use of pain relievers to no more than 2 days out of week to prevent risk of rebound or medication-overuse headache. Keep headache diary Follow up one year   Subjective:  Tiffany Barnett is a 21 year old right-handed female with neurocardiogenic syncope who follows up for migraine.   UPDATE: She has been taking 25mg  daily of nortriptyline instead of 50mg  for the past 6 months.  She has had an uptick in migraines.   Intensity:  6/10  Duration:  20 minutes Frequency:  daily for 7 days on the week of her period.   Frequency of abortive medication: 10 days a month. Current NSAIDS:  none Current analgesics:  none Current triptans:  Rizatriptan 10mg  Current ergotamine:  none Current anti-emetic:  none Current muscle relaxants:  none Current anti-anxiolytic:  none Current sleep aide:  none Current Antihypertensive medications:  none Current Antidepressant medications:  Nortriptyline 50mg  at bedtime Current Anticonvulsant medications:  none Current anti-CGRP:  none Current Vitamins/Herbal/Supplements:  none Current Antihistamines/Decongestants:  Xyzal Other therapy:  none Hormone/birth control:  none   She had an episode a couple of weeks ago in which she developed neck pain with photophobia and phonophobia but no headache.  It lasted that night.  She treated with an ice pack and resting in a dark and quiet room.   Caffeine:  no Alcohol:  no Smoker:  no Diet:  Hydrates. Exercise:  30 minutes 4 to 5 days a week Depression:  no; Anxiety:  no Other pain:  no Sleep hygiene:  Good.   HISTORY:  Onset: 21 years old.  Started to increase at age 18. Location:  Unilateral retro-orbital, either  side Quality:  Pounding/stabbing/pressure Initial intensity:  5/10 Aura:  no Prodrome:  no Postdrome:  no Associated symptoms:  Sometimes  photophobia no nausea, vomiting, phonophobia, osmophobia or visual disturbance.  She has not had any new worse headache of her life, waking up from sleep Initial Duration:  3 to 4 hours (sometimes 2 hours with sumatriptan 25mg ) Initial Frequency:  5 days per month Initial Frequency of abortive medication: 5 days per month Triggers: Unknown Relieving factors: None Activity:  Aggravates but still functions.   Past NSAIDS:  Excedrin, ibuprofen Past analgesics:  Tylenol, Excedrin Past abortive triptans:  sumatriptan 50mg  Past muscle relaxants:  tizanidine Past anti-emetic:  no Past antihypertensive medications:  no Past antidepressant medications:  no Past anticonvulsant medications:  no Past vitamins/Herbal/Supplements:  no Other past therapies:  no   Since childhood, she has had recurrent syncope which has been diagnosed as neurocardiogenic syncope.  She will feel lightheaded with dimming of vision, diaphoresis and palpitations.  She passes out for just a few seconds.  There is no postictal confusion.  It occurs spontaneously and not triggered by change in position or exertion (however one event was associated with injuring her finger).  It first occurred at age 69.  It occurred again at age 45 and then at age 57.  However, it has been occurring more frequent since age 40.  She had an episode last November and another episode in July.  She would experience a headache about 2 days prior to the syncope.  She does not have headache with the syncope.  There is  a question about whether the syncope is related to her migraines.     Family history of headache:  mom   CT of head from 06/03/17 was personally reviewed and revealed no mass lesion or acute intracranial abnormality.  PAST MEDICAL HISTORY: Past Medical History:  Diagnosis Date   Headache     Syncope    Vasovagal syncope     MEDICATIONS: Current Outpatient Medications on File Prior to Visit  Medication Sig Dispense Refill   Drospirenone-Ethinyl Estradiol (NIKKI PO) Take 1 tablet by mouth daily. (Patient not taking: Reported on 11/10/2020)     levocetirizine (XYZAL) 5 MG tablet Take 5 mg by mouth every evening.  4   nortriptyline (PAMELOR) 25 MG capsule Take 1 cap at bedtime 2-3 days prior to the period 34 capsule 5   nortriptyline (PAMELOR) 50 MG capsule Take 1 tablet at bedtime. 34 capsule 5   rizatriptan (MAXALT) 10 MG tablet TAKE ONE TABLET BY MOUTH AT ONSET OF HEADACHE. REPEAT IN TWO HOURS IF NEEDED 10 tablet 5   tiZANidine (ZANAFLEX) 2 MG tablet Take 1 tablet (2 mg total) by mouth at bedtime as needed for muscle spasms. 30 tablet 2   No current facility-administered medications on file prior to visit.    ALLERGIES: Allergies  Allergen Reactions   Other     FAMILY HISTORY: Family History  Problem Relation Age of Onset   Migraines Mother    Anemia Mother    Migraines Father       Objective:  Blood pressure 124/82, pulse 82, height 5' (1.524 m), weight 111 lb 9.6 oz (50.6 kg), SpO2 100 %. General: No acute distress.  Patient appears well-groomed.   Head:  Normocephalic/atraumatic Eyes:  Fundi examined but not visualized Neck: supple, no paraspinal tenderness, full range of motion Heart:  Regular rate and rhythm Lungs:  Clear to auscultation bilaterally Back: No paraspinal tenderness Neurological Exam: alert and oriented to person, place, and time.  Speech fluent and not dysarthric, language intact.  CN II-XII intact. Bulk and tone normal, muscle strength 5/5 throughout.  Sensation to light touch intact.  Deep tendon reflexes 2+ throughout, toes downgoing.  Finger to nose testing intact.  Gait normal, Romberg negative.   Shon Millet, DO

## 2022-09-25 ENCOUNTER — Encounter: Payer: Self-pay | Admitting: Neurology

## 2022-09-25 ENCOUNTER — Ambulatory Visit (INDEPENDENT_AMBULATORY_CARE_PROVIDER_SITE_OTHER): Payer: BC Managed Care – PPO | Admitting: Neurology

## 2022-09-25 VITALS — BP 124/82 | HR 82 | Ht 60.0 in | Wt 111.6 lb

## 2022-09-25 DIAGNOSIS — G43829 Menstrual migraine, not intractable, without status migrainosus: Secondary | ICD-10-CM

## 2022-09-25 NOTE — Patient Instructions (Signed)
Nortriptyline 50mg  at bedtime  Nortriptyine 50mg  plus 25mg  at bedtime for 7 days starting 2-3 days prior to period Rizatriptan as needed.  Limit use of pain relievers to no more than 2 days out of week to prevent risk of rebound or medication-overuse headache. Follow up 1 year

## 2022-09-30 ENCOUNTER — Ambulatory Visit: Payer: BC Managed Care – PPO | Admitting: Neurology

## 2022-11-14 ENCOUNTER — Other Ambulatory Visit: Payer: Self-pay | Admitting: Neurology

## 2022-11-15 DIAGNOSIS — T7840XA Allergy, unspecified, initial encounter: Secondary | ICD-10-CM | POA: Diagnosis not present

## 2022-12-20 ENCOUNTER — Other Ambulatory Visit: Payer: Self-pay | Admitting: Neurology

## 2022-12-25 DIAGNOSIS — J3081 Allergic rhinitis due to animal (cat) (dog) hair and dander: Secondary | ICD-10-CM | POA: Diagnosis not present

## 2022-12-25 DIAGNOSIS — J3089 Other allergic rhinitis: Secondary | ICD-10-CM | POA: Diagnosis not present

## 2022-12-25 DIAGNOSIS — H1045 Other chronic allergic conjunctivitis: Secondary | ICD-10-CM | POA: Diagnosis not present

## 2022-12-25 DIAGNOSIS — R21 Rash and other nonspecific skin eruption: Secondary | ICD-10-CM | POA: Diagnosis not present

## 2022-12-28 ENCOUNTER — Other Ambulatory Visit: Payer: Self-pay | Admitting: Neurology

## 2023-02-12 ENCOUNTER — Ambulatory Visit (INDEPENDENT_AMBULATORY_CARE_PROVIDER_SITE_OTHER): Payer: BC Managed Care – PPO | Admitting: Family Medicine

## 2023-02-12 ENCOUNTER — Encounter: Payer: Self-pay | Admitting: Family Medicine

## 2023-02-12 VITALS — BP 100/68 | HR 80 | Temp 98.1°F | Ht 61.5 in | Wt 114.2 lb

## 2023-02-12 DIAGNOSIS — R197 Diarrhea, unspecified: Secondary | ICD-10-CM

## 2023-02-12 LAB — COMPREHENSIVE METABOLIC PANEL
ALT: 12 U/L (ref 0–35)
AST: 19 U/L (ref 0–37)
Albumin: 4.5 g/dL (ref 3.5–5.2)
Alkaline Phosphatase: 71 U/L (ref 39–117)
BUN: 13 mg/dL (ref 6–23)
CO2: 28 mEq/L (ref 19–32)
Calcium: 9.5 mg/dL (ref 8.4–10.5)
Chloride: 103 mEq/L (ref 96–112)
Creatinine, Ser: 0.69 mg/dL (ref 0.40–1.20)
GFR: 121.72 mL/min (ref >60.00)
Glucose, Bld: 87 mg/dL (ref 70–99)
Potassium: 4.1 mEq/L (ref 3.5–5.1)
Sodium: 138 mEq/L (ref 135–145)
Total Bilirubin: 0.8 mg/dL (ref 0.2–1.2)
Total Protein: 7.5 g/dL (ref 6.0–8.3)

## 2023-02-12 LAB — CBC
HCT: 39.5 % (ref 36.0–46.0)
Hemoglobin: 13.1 g/dL (ref 12.0–15.0)
MCHC: 33.3 g/dL (ref 30.0–36.0)
MCV: 90.2 fl (ref 78.0–100.0)
Platelets: 233 10*3/uL (ref 150.0–400.0)
RBC: 4.38 Mil/uL (ref 3.87–5.11)
RDW: 13.4 % (ref 11.5–15.5)
WBC: 5.2 10*3/uL (ref 4.0–10.5)

## 2023-02-12 LAB — TSH: TSH: 1.11 u[IU]/mL (ref 0.35–5.50)

## 2023-02-12 NOTE — Progress Notes (Signed)
New Patient Office Visit  Subjective    Patient ID: Tiffany Barnett, female    DOB: 11/05/2001  Age: 22 y.o. MRN: [id number]  CC:  Chief Complaint  Patient presents with   Establish Care    HPI Tiffany Barnett presents to establish care Pt states her previous PCP left the area and she needed a new PCP. States she has a history of chronic migraines, usually associated with her periods. States that the medication she is taking is working well for her.   States that she is having some nail changes. States that some time ago she had a long bout of diarrhea. States that it just ended about a week ago.  States that the diarrhea lasted about 3 weeks, states that she didn't have any fever or chills, felt mild nausea, threw up 1 times.  States that she did not have any new foods or anything out of the ordinary. States that she was traveling in [place] at the time. States that she was going to the bathroom about 6 times per day. States that the BM's were very watery. States that eventually it went away on its own. There as some bloating but not abdominal cramping.  Pt reports she does have a family history of lactose intolerance in her father.   Outpatient Encounter Medications as of 02/12/2023  Medication Sig   Azelaic Acid 15 % gel SMARTSIG:1 sparingly Topical Daily   levocetirizine (XYZAL) 5 MG tablet Take 5 mg by mouth every evening.   nortriptyline (PAMELOR) 25 MG capsule Take 1 cap at bedtime 2-3 days prior to the period   nortriptyline (PAMELOR) 50 MG capsule TAKE 1 CAPSULE BY MOUTH AT BEDTIME   rizatriptan (MAXALT) 10 MG tablet TAKE ONE TABLET BY MOUTH AT ONSET OF HEADACHE; MAY REPEAT ONE TABLET IN 2 HOURS IF NEEDED.   No facility-administered encounter medications on file as of 02/12/2023.    Past Medical History:  Diagnosis Date   Allergy    Frequent headaches    Headache    History of fainting spells of unknown cause    Migraine    Syncope    Vasovagal syncope      Past Surgical History:  Procedure Laterality Date   KNEE SURGERY Right 2020   TYMPANOSTOMY TUBE PLACEMENT      Family History  Problem Relation Age of Onset   Arthritis Mother    Migraines Mother    Anemia Mother    Arthritis Father    Migraines Father    Early death Maternal Grandfather    Alcohol abuse Maternal Grandfather    Early death Paternal Grandmother    Hearing loss Paternal Grandfather     Social History   Socioeconomic History   Marital status: Single    Spouse name: Not on file   Number of children: 0   Years of education: Not on file   Highest education level: Some college, no degree  Occupational History   Occupation: Freight forwarder    Comment: [employer]  Tobacco Use   Smoking status: Never   Smokeless tobacco: Never  Vaping Use   Vaping Use: Never used  Substance and Sexual Activity   Alcohol use: Yes   Drug use: Never   Sexual activity: Yes  Other Topics Concern   Not on file  Social History Narrative   Patient is right-handed. She is single lives with her parents. She drinks tea and soda occasionally. She exercises sporadically.   Social Determinants of Health  Financial Resource Strain: Not on file  Food Insecurity: Not on file  Transportation Needs: Not on file  Physical Activity: Not on file  Stress: Not on file  Social Connections: Not on file  Intimate Partner Violence: Not on file    Review of Systems  All other systems reviewed and are negative.       Objective    BP 100/68 (BP Location: Right Arm, Patient Position: Sitting, Cuff Size: Normal)   Pulse 80   Temp 98.1 F (36.7 C) (Oral)   Ht 5' 1.5" (1.562 m)   Wt 114 lb 3.2 oz (51.8 kg)   LMP 02/03/2023 (Exact Date)   SpO2 99%   BMI 21.23 kg/m   Physical Exam Vitals reviewed.  Constitutional:      Appearance: Normal appearance. She is well-groomed and normal weight.  Eyes:     Conjunctiva/sclera: Conjunctivae normal.  Neck:     Thyroid: No thyromegaly.   Cardiovascular:     Rate and Rhythm: Normal rate and regular rhythm.     Pulses: Normal pulses.     Heart sounds: S1 normal and S2 normal.  Pulmonary:     Effort: Pulmonary effort is normal.     Breath sounds: Normal breath sounds and air entry.  Abdominal:     General: Bowel sounds are normal.  Musculoskeletal:     Right lower leg: No edema.     Left lower leg: No edema.  Neurological:     Mental Status: She is alert and oriented to person, place, and time. Mental status is at baseline.     Gait: Gait is intact.  Psychiatric:        Mood and Affect: Mood and affect normal.        Speech: Speech normal.        Behavior: Behavior normal.        Judgment: Judgment normal.        Assessment & Plan:   Problem List Items Addressed This Visit   None Visit Diagnoses     Diarrhea, unspecified type    -  Primary   Relevant Orders   Celiac Panel 10   CMP   TSH   CBC (no diff)     Unclear etiology, pt reports improvement in her symptoms over time without trying any treatments, Will order a basic set of bloodwork and also order celiac panel to rule out gluten allergy. We discussed her health maintenance and I advised she schedule a physical exam so we can obtain a pap smear.   No follow-ups on file.   Farrel Conners, MD

## 2023-02-14 LAB — CELIAC PANEL 10
Antigliadin Abs, IgA: 3 units (ref 0–19)
Endomysial IgA: NEGATIVE
Gliadin IgG: 2 units (ref 0–19)
IgA/Immunoglobulin A, Serum: 253 mg/dL (ref 87–352)
Tissue Transglut Ab: 2 U/mL (ref 0–5)
Transglutaminase IgA: <2 U/mL (ref 0–3)

## 2023-04-21 ENCOUNTER — Other Ambulatory Visit: Payer: Self-pay | Admitting: Neurology

## 2023-06-04 DIAGNOSIS — L700 Acne vulgaris: Secondary | ICD-10-CM | POA: Diagnosis not present

## 2023-06-10 ENCOUNTER — Other Ambulatory Visit: Payer: Self-pay

## 2023-06-10 MED ORDER — NORTRIPTYLINE HCL 25 MG PO CAPS: ORAL_CAPSULE | ORAL | 0 refills | Status: DC

## 2023-06-10 MED ORDER — NORTRIPTYLINE HCL 50 MG PO CAPS: ORAL_CAPSULE | ORAL | 0 refills | Status: DC

## 2023-06-10 MED ORDER — RIZATRIPTAN BENZOATE 10 MG PO TABS: ORAL_TABLET | ORAL | 0 refills | Status: DC

## 2023-07-10 ENCOUNTER — Encounter (INDEPENDENT_AMBULATORY_CARE_PROVIDER_SITE_OTHER): Payer: Self-pay

## 2023-08-20 ENCOUNTER — Encounter: Payer: Self-pay | Admitting: Family Medicine

## 2023-08-20 ENCOUNTER — Ambulatory Visit (INDEPENDENT_AMBULATORY_CARE_PROVIDER_SITE_OTHER): Payer: BC Managed Care – PPO | Admitting: Family Medicine

## 2023-08-20 ENCOUNTER — Other Ambulatory Visit (HOSPITAL_COMMUNITY)
Admission: RE | Admit: 2023-08-20 | Discharge: 2023-08-20 | Disposition: A | Discharge status: Home / Self Care | Payer: BC Managed Care – PPO | Source: Ambulatory Visit | Attending: Family Medicine | Admitting: Family Medicine

## 2023-08-20 VITALS — BP 96/62 | HR 110 | Temp 98.5°F | Ht 61.5 in | Wt 114.1 lb

## 2023-08-20 DIAGNOSIS — G43009 Migraine without aura, not intractable, without status migrainosus: Secondary | ICD-10-CM | POA: Diagnosis not present

## 2023-08-20 DIAGNOSIS — Z124 Encounter for screening for malignant neoplasm of cervix: Secondary | ICD-10-CM | POA: Insufficient documentation

## 2023-08-20 DIAGNOSIS — Z0000 Encounter for general adult medical examination without abnormal findings: Secondary | ICD-10-CM

## 2023-08-20 DIAGNOSIS — Z23 Encounter for immunization: Secondary | ICD-10-CM

## 2023-08-20 MED ORDER — NORTRIPTYLINE HCL 50 MG PO CAPS
ORAL_CAPSULE | ORAL | 1 refills | Status: DC
Start: 2023-08-20 — End: 2023-10-01

## 2023-08-20 NOTE — Progress Notes (Signed)
Complete physical exam  Patient: Tiffany Barnett   DOB: Jun 21, 2001   22 y.o. Female  MRN: [id number]  Subjective:    Chief Complaint  Patient presents with   Annual Exam    Tiffany Barnett is a 22 y.o. female who presents today for a complete physical exam. She reports consuming a general diet. The patient does not participate in regular exercise at present. She generally feels well. She reports sleeping well. She does not have additional problems to discuss today.    Most recent fall risk assessment:    09/25/2022    9:01 AM  Fall Risk   Falls in the past year? 0  Number falls in past yr: 0  Injury with Fall? 0  Follow up Falls evaluation completed     Most recent depression screenings:    08/20/2023    8:45 AM 02/12/2023   10:51 AM  PHQ 2/9 Scores  PHQ - 2 Score 0 0    Vision:Within last year and Dental: No current dental problems and Receives regular dental care  Patient Active Problem List   Diagnosis Date Noted   Neurocardiogenic syncope 07/24/2017   Migraine without aura and without status migrainosus, not intractable 07/24/2017      Patient Care Team: Karie Georges, MD as PCP - General (Family Medicine) Drema Dallas, DO as Consulting Physician (Neurology)   Outpatient Medications Prior to Visit  Medication Sig   Azelaic Acid 15 % gel SMARTSIG:1 sparingly Topical Daily   levocetirizine (XYZAL) 5 MG tablet Take 5 mg by mouth every evening.   nortriptyline (PAMELOR) 25 MG capsule Take 1 cap at bedtime 2-3 days prior to the period   rizatriptan (MAXALT) 10 MG tablet TAKE 1 TABLET BY MOUTH AT ONSET OF HEADACHE. MAY REPEAT 1 TABLET IN 2 HOURS IF NEEDED   [DISCONTINUED] nortriptyline (PAMELOR) 50 MG capsule TAKE 1 CAPSULE BY MOUTH AT BEDTIME   No facility-administered medications prior to visit.    Review of Systems  HENT:  Negative for hearing loss.   Eyes:  Negative for blurred vision.  Respiratory:  Negative for shortness of  breath.   Cardiovascular:  Negative for chest pain.  Gastrointestinal: Negative.   Genitourinary: Negative.   Musculoskeletal:  Negative for back pain.  Neurological:  Negative for headaches.  Psychiatric/Behavioral:  Negative for depression.   All other systems reviewed and are negative.      Objective:     BP 96/62 (BP Location: Right Arm, Patient Position: Sitting, Cuff Size: Normal)   Pulse (!) 110   Temp 98.5 F (36.9 C) (Oral)   Ht 5' 1.5" (1.562 m)   Wt 114 lb 1.6 oz (51.8 kg)   LMP 07/19/2023 (Exact Date)   SpO2 99%   BMI 21.21 kg/m    Physical Exam Vitals reviewed. Exam conducted with a chaperone present.  Constitutional:      Appearance: She is normal weight.  Cardiovascular:     Rate and Rhythm: Normal rate and regular rhythm.     Pulses: Normal pulses.     Heart sounds: Normal heart sounds.  Pulmonary:     Effort: Pulmonary effort is normal.     Breath sounds: Normal breath sounds.  Chest:     Chest wall: No mass.  Breasts:    Tanner Score is 5.     Right: Normal. No mass or tenderness.     Left: Normal. No mass or tenderness.       Comments: There  is a tiny round nodule felt on the left breast in the location marked. It is freely moveable, soft, <0.5 cm in diameter, not fixed, not tender.  Abdominal:     General: Abdomen is flat. Bowel sounds are normal.     Palpations: Abdomen is soft.  Genitourinary:    General: Normal vulva.     Exam position: Lithotomy position.     Tanner stage (genital): 5.     Vagina: Normal.     Cervix: Normal.     Uterus: Normal.      Adnexa: Right adnexa normal and left adnexa normal.     Rectum: Normal.  Lymphadenopathy:     Upper Body:     Right upper body: No axillary adenopathy.     Left upper body: No axillary adenopathy.  Neurological:     General: No focal deficit present.     Mental Status: She is alert and oriented to person, place, and time.  Psychiatric:        Mood and Affect: Mood and affect normal.       No results found for any visits on 08/20/23.     Assessment & Plan:    Routine Health Maintenance and Physical Exam  Immunization History  Administered Date(s) Administered   Influenza, Seasonal, Injecte, Preservative Fre 08/20/2023   Influenza-Unspecified 08/25/2022    Health Maintenance  Topic Date Due   HPV VACCINES (1 - 3-dose series) Never done   DTaP/Tdap/Td (1 - Tdap) Never done   Cervical Cancer Screening (Pap smear)  Never done   COVID-19 Vaccine (1 - 2023-24 season) Never done   Hepatitis C Screening  02/12/2024 (Originally 12/28/2016)   HIV Screening  02/12/2024 (Originally 12/28/2013)   INFLUENZA VACCINE  Completed    Discussed health benefits of physical activity, and encouraged her to engage in regular exercise appropriate for her age and condition.  Need for immunization against influenza -     Flu vaccine trivalent PF, 6mos and older(Flulaval,Afluria,Fluarix,Fluzone)  Routine general medical examination at a health care facility  Cervical cancer screening -     Cytology - PAP  Migraine without aura and without status migrainosus, not intractable -     Nortriptyline HCl; TAKE 1 CAPSULE BY MOUTH AT BEDTIME  Dispense: 90 capsule; Refill: 1  Normal physical exam findings today. Pt does not engage in much exercise so I counseled patient on ways to get additional exercise through the day. Pap smear sent to lab. Handouts on healthy eating and exercise given to patient. The nodule felt on exam appears benign in quality. Counseled patient to continue monthly  breast exams and if the nodule increases in size to notify me and further testing will be ordered.   Return in 1 year (on 08/19/2024).     Karie Georges, MD

## 2023-08-20 NOTE — Patient Instructions (Signed)

## 2023-08-25 LAB — CYTOLOGY - PAP
Comment: NEGATIVE
Diagnosis: NEGATIVE
High risk HPV: NEGATIVE

## 2023-08-26 ENCOUNTER — Other Ambulatory Visit: Payer: Self-pay | Admitting: Neurology

## 2023-09-01 ENCOUNTER — Telehealth: Payer: Self-pay | Admitting: Neurology

## 2023-09-01 NOTE — Telephone Encounter (Signed)
Patient noticed over the weekend, patient noticed she was dizzy and shaking with rapid heart rate and medication seems to not be working. Rizatriptan 10 mg

## 2023-09-02 NOTE — Telephone Encounter (Signed)
Tried calling patient to get more information on what is going. No answer LMOVM.

## 2023-09-04 ENCOUNTER — Telehealth: Payer: Self-pay

## 2023-09-04 NOTE — Telephone Encounter (Signed)
Per patient her symptoms started on Friday, she took a rizatriptan that morning feel asleep and had to take another when she woke up.  Saturday and Sunday was the same.  Monday and Tuesday she was okay.   Today she feels the same as Friday. .    Please advise.

## 2023-09-05 NOTE — Telephone Encounter (Signed)
No answer, If these episodes are associated with her migraine headaches, it is likely migraine.  In which case, if she has a driver, she may come in for a migraine cocktail.  We can provide her samples of Nurtec to try instead of rizatriptan (1 tablet daily as needed) and let us know how she is).  HOWEVER, if she is continuing to have rapid heart rate/palpitations, then she should likely follow up with her PCP to see if something else is going on.

## 2023-09-08 ENCOUNTER — Ambulatory Visit (INDEPENDENT_AMBULATORY_CARE_PROVIDER_SITE_OTHER): Payer: BC Managed Care – PPO

## 2023-09-08 DIAGNOSIS — G43829 Menstrual migraine, not intractable, without status migrainosus: Secondary | ICD-10-CM

## 2023-09-08 MED ORDER — KETOROLAC TROMETHAMINE 60 MG/2ML IM SOLN
60.0000 mg | Freq: Once | INTRAMUSCULAR | Status: AC
Start: 2023-09-08 — End: 2023-09-08
  Administered 2023-09-08: 60 mg via INTRAMUSCULAR

## 2023-09-08 MED ORDER — METOCLOPRAMIDE HCL 5 MG/ML IJ SOLN
10.0000 mg | Freq: Once | INTRAMUSCULAR | Status: AC
Start: 2023-09-08 — End: 2023-09-08
  Administered 2023-09-08: 10 mg via INTRAMUSCULAR

## 2023-09-08 MED ORDER — DIPHENHYDRAMINE HCL 50 MG/ML IJ SOLN
50.0000 mg | Freq: Once | INTRAMUSCULAR | Status: AC
Start: 2023-09-08 — End: 2023-09-08
  Administered 2023-09-08: 25 mg via INTRAMUSCULAR

## 2023-09-08 NOTE — Progress Notes (Signed)
Medication Samples have been provided to the patient.  Drug name: Nurtec       Strength: 75 mg        Qty: 2  LOT: [id number]  Exp.Date: 06/302026  Dosing instructions: as needed  The patient has been instructed regarding the correct time, dose, and frequency of taking this medication, including desired effects and most common side effects.   Leida Lauth 1:25 PM 09/08/2023       Per Dr. Everlena Cooper if the cocktail doesn't help we can send a script with the patient today to take to the pharmacy.  Patient will call if she will like one.

## 2023-09-11 ENCOUNTER — Encounter: Payer: Self-pay | Admitting: Family Medicine

## 2023-09-30 NOTE — Progress Notes (Unsigned)
NEUROLOGY FOLLOW UP OFFICE NOTE  Tiffany Barnett [id number]  Assessment/Plan:   Menstrual-related migraines without aura, without status migrainosus, not intractable.  Migraine prevention:  I would like to transition her from nortriptyline to a CGRP inhibitor for better tolerance and efficacy.  Plan to start Qulipta 60mg  daily.  Continue nortriptyline for now (50mg  at bedtime, 75mg  (take with 25mg  capsule) for 7 days starting 2-3 days prior to start of menses) with plan to taper off if headaches better controlled. Migraine rescue:  rizatriptan 10mg  Limit use of pain relievers to no more than 2 days out of week to prevent risk of rebound or medication-overuse headache. Keep headache diary Follow up 5 months.   Subjective:  Tiffany Barnett is a 22 year old right-handed female with neurocardiogenic syncope who follows up for migraine.   UPDATE: Last week had intractable migraine for 10 days but also had dizziness and palpitations.  Broke with migraine cocktail.  Otherwise, her migraines are occurring 2-3 days consecutively during week of menses and 4 other days throughout the month.  Responds to rizatriptan within 20 minutes.   Current NSAIDS:  none Current analgesics:  none Current triptans:  Rizatriptan 10mg  Current ergotamine:  none Current anti-emetic:  none Current muscle relaxants:  none Current anti-anxiolytic:  none Current sleep aide:  none Current Antihypertensive medications:  none Current Antidepressant medications:  Nortriptyline 50mg  at bedtime Current Anticonvulsant medications:  none Current anti-CGRP:  none Current Vitamins/Herbal/Supplements:  none Current Antihistamines/Decongestants:  Xyzal Other therapy:  none Hormone/birth control:  none    Caffeine:  no Alcohol:  no Smoker:  no Diet:  Hydrates. Exercise:  30 minutes 4 to 5 days a week Depression:  no; Anxiety:  no Other pain:  no Sleep hygiene:  Good.   HISTORY:  Onset: 22 years  old.  Started to increase at age 89. Location:  Unilateral retro-orbital, either side Quality:  Pounding/stabbing/pressure Initial intensity:  5/10 Aura:  no Prodrome:  no Postdrome:  no Associated symptoms:  Sometimes  photophobia no nausea, vomiting, phonophobia, osmophobia or visual disturbance.  She has not had any new worse headache of her life, waking up from sleep Initial Duration:  3 to 4 hours (sometimes 2 hours with sumatriptan 25mg ) Initial Frequency:  5 days per month Initial Frequency of abortive medication: 5 days per month Triggers: Unknown Relieving factors: None Activity:  Aggravates but still functions.   Past NSAIDS:  Excedrin, ibuprofen Past analgesics:  Tylenol, Excedrin Past abortive triptans:  sumatriptan 50mg  Past muscle relaxants:  tizanidine Past anti-emetic:  no Past antihypertensive medications:  no Past antidepressant medications:  no Past anticonvulsant medications:  no Past vitamins/Herbal/Supplements:  no Other past therapies:  no   Since childhood, she has had recurrent syncope which has been diagnosed as neurocardiogenic syncope.  She will feel lightheaded with dimming of vision, diaphoresis and palpitations.  She passes out for just a few seconds.  There is no postictal confusion.  It occurs spontaneously and not triggered by change in position or exertion (however one event was associated with injuring her finger).  It first occurred at age 15.  It occurred again at age 53 and then at age 64.  However, it has been occurring more frequent since age 53.  She had an episode last November and another episode in July.  She would experience a headache about 2 days prior to the syncope.  She does not have headache with the syncope.  There is a question about whether the syncope  is related to her migraines.     Family history of headache:  mom   CT of head from 06/03/17 was personally reviewed and revealed no mass lesion or acute intracranial  abnormality.  PAST MEDICAL HISTORY: Past Medical History:  Diagnosis Date   Allergy    Frequent headaches    Headache    History of fainting spells of unknown cause    Migraine    Syncope    Vasovagal syncope     MEDICATIONS: Current Outpatient Medications on File Prior to Visit  Medication Sig Dispense Refill   Azelaic Acid 15 % gel SMARTSIG:1 sparingly Topical Daily     levocetirizine (XYZAL) 5 MG tablet Take 5 mg by mouth every evening.  4   nortriptyline (PAMELOR) 25 MG capsule TAKE 1 CAPSULE AT BEDTIME 7 DAYS PER MONTH, START 2 TO 3 DAYS PRIOR TO PERIOD 21 capsule 0   nortriptyline (PAMELOR) 50 MG capsule TAKE 1 CAPSULE BY MOUTH AT BEDTIME 90 capsule 1   rizatriptan (MAXALT) 10 MG tablet TAKE 1 TABLET AT ONSET OF HEADACHE, MAY REPEAT 1 TABLET IN 2 HOURS IF NEEDED 30 tablet 0   No current facility-administered medications on file prior to visit.    ALLERGIES: Allergies  Allergen Reactions   Other     Dust, mold and pollen cause sinus congestion flare-up    FAMILY HISTORY: Family History  Problem Relation Age of Onset   Arthritis Mother    Migraines Mother    Anemia Mother    Arthritis Father    Migraines Father    Early death Maternal Grandfather    Alcohol abuse Maternal Grandfather    Early death Paternal Grandmother    Hearing loss Paternal Grandfather       Objective:  Blood pressure 118/79, pulse 92, height 5' (1.524 m), weight 117 lb (53.1 kg), SpO2 100%. General: No acute distress.  Patient appears well-groomed.   Head:  Normocephalic/atraumatic Eyes:  Fundi examined but not visualized Neck: supple, no paraspinal tenderness, full range of motion Neurological Exam: alert and oriented.  Speech fluent and not dysarthric, language intact.  CN II-XII intact. Bulk and tone normal, muscle strength 5/5 throughout.  Sensation to light touch intact.  Deep tendon reflexes 2+ throughout.  Finger to nose testing intact.  Gait normal, Romberg negative.   Shon Millet, DO

## 2023-10-01 ENCOUNTER — Encounter (HOSPITAL_BASED_OUTPATIENT_CLINIC_OR_DEPARTMENT_OTHER): Payer: Self-pay | Admitting: Emergency Medicine

## 2023-10-01 ENCOUNTER — Ambulatory Visit (INDEPENDENT_AMBULATORY_CARE_PROVIDER_SITE_OTHER): Payer: BC Managed Care – PPO | Admitting: Neurology

## 2023-10-01 ENCOUNTER — Encounter: Payer: Self-pay | Admitting: Neurology

## 2023-10-01 ENCOUNTER — Other Ambulatory Visit: Payer: Self-pay

## 2023-10-01 ENCOUNTER — Emergency Department (HOSPITAL_BASED_OUTPATIENT_CLINIC_OR_DEPARTMENT_OTHER)
Admission: EM | Admit: 2023-10-01 | Discharge: 2023-10-01 | Disposition: A | Discharge status: Home / Self Care | Payer: BC Managed Care – PPO | Attending: Emergency Medicine | Admitting: Emergency Medicine

## 2023-10-01 ENCOUNTER — Emergency Department (HOSPITAL_BASED_OUTPATIENT_CLINIC_OR_DEPARTMENT_OTHER): Payer: BC Managed Care – PPO | Admitting: Radiology

## 2023-10-01 DIAGNOSIS — R079 Chest pain, unspecified: Secondary | ICD-10-CM | POA: Insufficient documentation

## 2023-10-01 DIAGNOSIS — G43009 Migraine without aura, not intractable, without status migrainosus: Secondary | ICD-10-CM | POA: Diagnosis not present

## 2023-10-01 LAB — BASIC METABOLIC PANEL
Anion gap: 9 (ref 5–15)
BUN: 11 mg/dL (ref 6–20)
CO2: 24 mmol/L (ref 22–32)
Calcium: 9.8 mg/dL (ref 8.9–10.3)
Chloride: 103 mmol/L (ref 98–111)
Creatinine, Ser: 0.82 mg/dL (ref 0.44–1.00)
GFR, Estimated: >60 mL/min (ref >60)
Glucose, Bld: 101 mg/dL — ABNORMAL HIGH (ref 70–99)
Potassium: 3.7 mmol/L (ref 3.5–5.1)
Sodium: 136 mmol/L (ref 135–145)

## 2023-10-01 LAB — CBC
HCT: 42.1 % (ref 36.0–46.0)
Hemoglobin: 14.1 g/dL (ref 12.0–15.0)
MCH: 30.9 pg (ref 26.0–34.0)
MCHC: 33.5 g/dL (ref 30.0–36.0)
MCV: 92.3 fL (ref 80.0–100.0)
Platelets: 236 10*3/uL (ref 150–400)
RBC: 4.56 MIL/uL (ref 3.87–5.11)
RDW: 12.7 % (ref 11.5–15.5)
WBC: 5.8 10*3/uL (ref 4.0–10.5)
nRBC: 0 % (ref 0.0–0.2)

## 2023-10-01 LAB — TROPONIN I (HIGH SENSITIVITY)
Troponin I (High Sensitivity): <2 ng/L (ref <18)
Troponin I (High Sensitivity): <2 ng/L (ref <18)

## 2023-10-01 LAB — D-DIMER, QUANTITATIVE: D-Dimer, Quant: <0.27 ug{FEU}/mL (ref 0.00–0.50)

## 2023-10-01 LAB — PREGNANCY, URINE: Preg Test, Ur: NEGATIVE

## 2023-10-01 MED ORDER — QULIPTA 60 MG PO TABS: 60.0000 mg | ORAL_TABLET | Freq: Every day | ORAL | 1 refills | Status: DC

## 2023-10-01 MED ORDER — NORTRIPTYLINE HCL 50 MG PO CAPS: ORAL_CAPSULE | ORAL | 1 refills | Status: DC

## 2023-10-01 MED ORDER — RIZATRIPTAN BENZOATE 10 MG PO TABS: ORAL_TABLET | ORAL | 1 refills | Status: DC

## 2023-10-01 MED ORDER — NORTRIPTYLINE HCL 25 MG PO CAPS: ORAL_CAPSULE | ORAL | 1 refills | Status: DC

## 2023-10-01 NOTE — Patient Instructions (Signed)
Start Qulipta 60mg  daily Continue nortriptyline as is for now Rizatriptan as needed Follow up 5 months.

## 2023-10-01 NOTE — ED Triage Notes (Signed)
Chest pain started around 3:30pm "I normally have some chest pain after having caffeine" for the past month Denies n/v  no sob Called triage nurse and advised to get checked out.

## 2023-10-01 NOTE — Discharge Instructions (Signed)
You were seen in the emergency room for chest pain.  Because your chest pain occurs after drinking coffee and energy drinks I would try to decrease intake of caffeine as well as avoiding triggers that worsen chest pain.  Your lab work and imaging came back reassuring.   Please follow-up with primary care to ensure resolution of symptoms.  Return to emergency room with new or worsening symptoms. If chest pain continues you can speak with primary care provider about referral to cardiology.

## 2023-10-01 NOTE — ED Provider Notes (Signed)
[place] EMERGENCY DEPARTMENT AT [facility] Provider Note   CSN: [id number] Arrival date & time: 10/01/23  1554     History  Chief Complaint  Patient presents with   Chest Pain    Tiffany Barnett is a 22 y.o. female with past medical history of migraines, vasovagal syncope presenting to emergency room with chest pain that started around 3 PM.  Patient reports she has consistently had increased chest pain after drinking caffeine.  Patient denies any associated shortness of breath, palpitations, dizziness or lightheadedness.  Chest pain does not radiate.  Chest pain feels like sharp shooting pain.  Chest pain has not been noticed to be worse with exertion.  Patient contacted her primary care who encouraged to come to emergency room.  Patient reports chest pain has resolved at this time and she is feeling back to her baseline.  Denies any associated symptoms.   Chest Pain      Home Medications Prior to Admission medications   Medication Sig Start Date End Date Taking? Authorizing Provider  Atogepant (QULIPTA) 60 MG TABS Take 1 tablet (60 mg total) by mouth daily. 10/01/23   Drema Dallas, DO  Azelaic Acid 15 % gel SMARTSIG:1 sparingly Topical Daily 06/12/22   [provider]  levocetirizine (XYZAL) 5 MG tablet Take 5 mg by mouth every evening. 05/29/17   [provider]  nortriptyline (PAMELOR) 25 MG capsule TAKE 1 CAPSULE AT BEDTIME 7 DAYS PER MONTH, START 2 TO 3 DAYS PRIOR TO PERIOD 10/01/23   Drema Dallas, DO  nortriptyline (PAMELOR) 50 MG capsule TAKE 1 CAPSULE BY MOUTH AT BEDTIME 10/01/23   Jaffe, Adam R, DO  rizatriptan (MAXALT) 10 MG tablet TAKE 1 TABLET AT ONSET OF HEADACHE, MAY REPEAT 1 TABLET IN 2 HOURS IF NEEDED 10/01/23   Drema Dallas, DO      Allergies    Other    Review of Systems   Review of Systems  Cardiovascular:  Positive for chest pain.    Physical Exam Updated Vital Signs BP (!) 151/91 (BP Location: Right Arm)   Pulse  (!) 119   Temp 98.6 F (37 C)   Resp 18   LMP 09/27/2023   SpO2 100%  Physical Exam  ED Results / Procedures / Treatments   Labs (all labs ordered are listed, but only abnormal results are displayed) Labs Reviewed  BASIC METABOLIC PANEL - Abnormal; Notable for the following components:      Result Value   Glucose, Bld 101 (*)    All other components within normal limits  CBC  PREGNANCY, URINE  D-DIMER, QUANTITATIVE  TROPONIN I (HIGH SENSITIVITY)  TROPONIN I (HIGH SENSITIVITY)    EKG EKG Interpretation Date/Time:  Wednesday October 01 2023 16:04:36 EST Ventricular Rate:  113 PR Interval:  174 QRS Duration:  92 QT Interval:  342 QTC Calculation: 469 R Axis:   79  Text Interpretation: Sinus tachycardia Right atrial enlargement Borderline ECG When compared with ECG of 03-Jun-2017 15:44, rate increased Confirmed by Benjiman Core [phone]) on 10/01/2023 4:26:07 PM  Radiology DG Chest 2 View  Result Date: 10/01/2023 CLINICAL DATA:  Chest pain for 1 month. EXAM: CHEST - 2 VIEW COMPARISON:  10/31/2016. FINDINGS: Bilateral lung fields are clear. Bilateral costophrenic angles are clear. Normal cardio-mediastinal silhouette. No acute osseous abnormalities. The soft tissues are within normal limits. IMPRESSION: *No active cardiopulmonary disease. Electronically Signed   By: Jules Schick M.D.   On: 10/01/2023 17:56  Procedures Procedures    Medications Ordered in ED Medications - No data to display  ED Course/ Medical Decision Making/ A&P                                 Medical Decision Making Amount and/or Complexity of Data Reviewed Labs: ordered. Radiology: ordered.   Tiffany Barnett 22 y.o. presented today for chest pain. Working DDx that I considered at this time includes, but not limited to, ACS, GERD, pe, pna, aortic dissection, pneumothorax, MSK path, anemia, esophageal rupture, CHF exacerbation, valvular disorder, myocarditis, pericarditis,  endocarditis, pericardial effusion/cardiac tamponade, pulmonary edema, gastritis/PUD, esophagitis.  R/o Dx: These are considered less likely due to history of present illness and physical exam findings.   Review of prior external notes: 10/01/2023 OV for HA  Unique Tests and My Interpretation:  EKG: Rate, rhythm, axis, intervals all examined: sinus, with probably right atrial enlargement  Troponin: <2, repeat <2  CXR: no active disease  CBC: no leukocytosis, no anemia BMP: unremarkable  Dimmer: negative  Problem List / ED Course / Critical interventions / Medication management  Patient reporting to emergency room with chest pain.  Patient has no sign of fluid overload.  Patient is overall well-appearing hemodynamically stable.  Patient has EKG without any ST changes, 2 negative troponins and chest x-ray with no active disease.  Symptoms seem consistent with acid reflux, patient did not want medication as symptoms had resolved at the time stay.  Talked to patient on trying to avoid triggers, patient agreed and will follow-up with primary care to ensure resolution of symptoms.  If symptoms continue patient will return to emergency room.  Reevaluation of the patient  showed that the patients symptoms resolved Patients vitals assessed. Upon arrival patient is hemodynamically stable.  I have reviewed the patients home medicines and have made adjustments as needed     Plan:  F/u w/ PCP to ensure resolution of sx.  Patient was given return precautions. Patient stable for discharge at this time.  Patient educated on sx/ dx and verbalized understanding of plan.  Will return to ER w/ new or worsening sx.          Final Clinical Impression(s) / ED Diagnoses Final diagnoses:  Chest pain, unspecified type    Rx / DC Orders ED Discharge Orders     None         Smitty Knudsen, PA-C 10/03/23 1043    Benjiman Core, MD 10/06/23 1552

## 2023-10-08 ENCOUNTER — Encounter: Payer: Self-pay | Admitting: Family Medicine

## 2023-10-08 ENCOUNTER — Ambulatory Visit (INDEPENDENT_AMBULATORY_CARE_PROVIDER_SITE_OTHER): Payer: BC Managed Care – PPO | Admitting: Family Medicine

## 2023-10-08 ENCOUNTER — Telehealth: Payer: Self-pay | Admitting: Neurology

## 2023-10-08 VITALS — BP 98/58 | HR 93 | Temp 97.6°F | Ht 60.0 in | Wt 116.7 lb

## 2023-10-08 DIAGNOSIS — L604 Beau's lines: Secondary | ICD-10-CM | POA: Diagnosis not present

## 2023-10-08 DIAGNOSIS — I8390 Asymptomatic varicose veins of unspecified lower extremity: Secondary | ICD-10-CM

## 2023-10-08 NOTE — Patient Instructions (Signed)
Compression stockings-- mild 10-15 mmHg

## 2023-10-08 NOTE — Progress Notes (Signed)
Established Patient Office Visit  Subjective   Patient ID: Tiffany Barnett, female    DOB: 28-Jun-2001  Age: 22 y.o. MRN: [id number]  Chief Complaint  Patient presents with   Follow-up    Patient presents for ER follow up due to left-sided chest pain intermittently x1 month, diagnosed with GERD    Pt was seen in the ER with chest pain after drinking some caffeine, states that she had a complete work up which was unremarkable, she had a slightly elevated blood pressure and also her HR was 113. Chest pain is resolved. No recurrent episodes. They had recommended an antacid for a presumed diagnosis of GERD. She described it as a sharp stabbing pain on the left then with some pressure in the center of her chest. States that initially her left arm went numb.  Patient reports that she has developed horizontal lines on her toenails and was concerned about why this might be. States that she used to work in [employer] and her feet would get wet often in her shoes.   Pt also reporting some mild varicose veins in her right leg and was concerned that they were getting worse. Wanted advice on management.     Current Outpatient Medications  Medication Instructions   Azelaic Acid 15 % gel SMARTSIG:1 sparingly Topical Daily   levocetirizine (XYZAL) 5 mg, Oral, Every evening   nortriptyline (PAMELOR) 25 MG capsule TAKE 1 CAPSULE AT BEDTIME 7 DAYS PER MONTH, START 2 TO 3 DAYS PRIOR TO PERIOD   nortriptyline (PAMELOR) 50 MG capsule TAKE 1 CAPSULE BY MOUTH AT BEDTIME   Qulipta 60 mg, Oral, Daily   rizatriptan (MAXALT) 10 MG tablet TAKE 1 TABLET AT ONSET OF HEADACHE, MAY REPEAT 1 TABLET IN 2 HOURS IF NEEDED    Patient Active Problem List   Diagnosis Date Noted   Neurocardiogenic syncope 07/24/2017   Migraine without aura and without status migrainosus, not intractable 07/24/2017      Review of Systems  All other systems reviewed and are negative.     Objective:     BP (!) 98/58 (BP  Location: Right Arm, Patient Position: Sitting, Cuff Size: Normal)   Pulse 93   Temp 97.6 F (36.4 C) (Oral)   Ht 5' (1.524 m)   Wt 116 lb 11.2 oz (52.9 kg)   LMP 09/27/2023   SpO2 98%   BMI 22.79 kg/m    Physical Exam Vitals reviewed.  Constitutional:      Appearance: Normal appearance. She is well-groomed and normal weight.  Cardiovascular:     Rate and Rhythm: Normal rate and regular rhythm.     Pulses: Normal pulses.     Heart sounds: S1 normal and S2 normal.  Pulmonary:     Effort: Pulmonary effort is normal.     Breath sounds: Normal breath sounds and air entry.  Musculoskeletal:     Right lower leg: No edema.     Left lower leg: No edema.  Skin:    Comments: Beau lines on the right and left great toenails. Tiny scattered varicose veins on the right leg  Neurological:     Mental Status: She is alert and oriented to person, place, and time. Mental status is at baseline.     Gait: Gait is intact.  Psychiatric:        Mood and Affect: Mood and affect normal.        Speech: Speech normal.        Behavior: Behavior  normal.      No results found for any visits on 10/08/23.    The ASCVD Risk score (Arnett DK, et al., 2019) failed to calculate for the following reasons:   The 2019 ASCVD risk score is only valid for ages 33 to 14    Assessment & Plan:  Varicose veins of calf  Beau lines   Recommended she start wearing compression stockings when on her feet for long periods and this should help prevent the varicose veins from getting worse. The beau lines are likely from the chronic moisture in her shoes. Reassured patient that there is new healthy nail growing in behind the beau line.   The chest pain from the ED visit has resolved, not recurred. I advised that we should just monitor for continued symptoms. Recommended using OTC antacids as needed for heartburn sx which will help prevent the sharp pains from occurring.   No follow-ups on file.    Karie Georges, MD

## 2023-10-08 NOTE — Telephone Encounter (Signed)
Pt called and states that [facility] let her know that the medication Bennie Pierini needs a prior auth from our office before they can fill it

## 2023-10-10 ENCOUNTER — Telehealth: Payer: Self-pay

## 2023-10-10 ENCOUNTER — Other Ambulatory Visit (HOSPITAL_COMMUNITY): Payer: Self-pay

## 2023-10-10 NOTE — Telephone Encounter (Signed)
PA request has been Approved. New Encounter created for follow up. For additional info see Pharmacy Prior Auth telephone encounter from 11/15.

## 2023-10-10 NOTE — Telephone Encounter (Signed)
*  [facility]  Pharmacy Patient Advocate Encounter  Received notification from [facility] that Prior Authorization for Qulipta 60MG  tablets  has been APPROVED from 10/10/2023 to 10/08/2024   PA #/Case ID/Reference #: [id number]

## 2023-10-16 ENCOUNTER — Telehealth: Payer: Self-pay

## 2023-10-16 NOTE — Telephone Encounter (Signed)
Transition Care Management Unsuccessful Follow-up Telephone Call  Date of discharge and from where:  10/01/2023 [facility]  Attempts:  2nd Attempt  Reason for unsuccessful TCM follow-up call:  No answer/busy unable to leave message.  Azriella Mattia [facility]  [facility], [facility] Guide Direct Dial: [phone]  Website: [email or web address]

## 2023-10-16 NOTE — Telephone Encounter (Signed)
Transition Care Management Unsuccessful Follow-up Telephone Call  Date of discharge and from where:  10/01/2023 [facility]  Attempts:  1st Attempt  Reason for unsuccessful TCM follow-up call:  Left voice message  Jovanny Stephanie [facility]  [facility], [facility] Guide Direct Dial: [phone]  Website: [email or web address]

## 2023-10-30 ENCOUNTER — Encounter: Payer: Self-pay | Admitting: Family Medicine

## 2023-10-31 NOTE — Telephone Encounter (Signed)
Can you call her to get more information, I'm not sure what she is referring to.Marland KitchenMarland Kitchen

## 2023-10-31 NOTE — Telephone Encounter (Signed)
Left message for patient to return my call.

## 2023-11-05 NOTE — Telephone Encounter (Signed)
Left a message for the patient to return my call.  

## 2023-11-11 NOTE — Telephone Encounter (Signed)
Left a message for the patient to return my call.  

## 2023-12-03 ENCOUNTER — Ambulatory Visit: Payer: BC Managed Care – PPO | Admitting: Family Medicine

## 2023-12-24 DIAGNOSIS — J3081 Allergic rhinitis due to animal (cat) (dog) hair and dander: Secondary | ICD-10-CM | POA: Diagnosis not present

## 2023-12-24 DIAGNOSIS — R21 Rash and other nonspecific skin eruption: Secondary | ICD-10-CM | POA: Diagnosis not present

## 2023-12-24 DIAGNOSIS — H1045 Other chronic allergic conjunctivitis: Secondary | ICD-10-CM | POA: Diagnosis not present

## 2023-12-24 DIAGNOSIS — J3089 Other allergic rhinitis: Secondary | ICD-10-CM | POA: Diagnosis not present

## 2024-01-30 ENCOUNTER — Ambulatory Visit: Payer: BC Managed Care – PPO | Admitting: Family Medicine

## 2024-02-04 ENCOUNTER — Encounter: Payer: Self-pay | Admitting: Family Medicine

## 2024-02-04 ENCOUNTER — Ambulatory Visit (INDEPENDENT_AMBULATORY_CARE_PROVIDER_SITE_OTHER): Payer: BC Managed Care – PPO | Admitting: Family Medicine

## 2024-02-04 VITALS — BP 98/64 | HR 85 | Temp 98.5°F | Ht 60.0 in | Wt 108.3 lb

## 2024-02-04 DIAGNOSIS — R195 Other fecal abnormalities: Secondary | ICD-10-CM

## 2024-02-04 DIAGNOSIS — F411 Generalized anxiety disorder: Secondary | ICD-10-CM | POA: Diagnosis not present

## 2024-02-04 DIAGNOSIS — K5909 Other constipation: Secondary | ICD-10-CM | POA: Diagnosis not present

## 2024-02-04 MED ORDER — ESCITALOPRAM OXALATE 5 MG PO TABS: 5.0000 mg | ORAL_TABLET | Freq: Every day | ORAL | 3 refills | Status: DC

## 2024-02-04 NOTE — Patient Instructions (Signed)
 1 scoop miralax daily

## 2024-02-04 NOTE — Assessment & Plan Note (Signed)
 GAD score is elevated, I reviewed her medications, she is weaning off the nortriptyline for her migraines and was placed on Qlipta. I believe that the nortriptyline was helping on some level with her anxiety, we discussed other medications and we will start her on lexapro 5 mg daily. I will see her back in 2 months to reassess her symptoms.

## 2024-02-04 NOTE — Progress Notes (Signed)
 Established Patient Office Visit  Subjective   Patient ID: Tiffany Barnett, female    DOB: Jan 23, 2001  Age: 23 y.o. MRN: [id number]  Chief Complaint  Patient presents with   Anxiety    Patient complains of worsening anxiety for the past few months, noticed after having to drive on the highway which she does not care for and fearful while grocery shopping     Pt is reporting worsening anxiety for the last few months, states that it is fairly constant. States that she is scared to do things now that didn't scare her before. Is afraid to drive on the highway and going to the grocery store. Pt states that she has always been somewhat anxious, however this is worse than usual.  Pt states that her focus is ok, is able to push aside ruminating thoughts temporarily, but it kind of builds up and comes back eventually. Sleep is "ok", takes about 1-2 hours to fall asleep, a couple of nighttime awakenings, but still feels like she is getting enough.   Bowel issues-- pt states that after the diarrhea episode she is not having problems with chronic constipation. States that she doesn't feel like she is evacuating her colon as she should. There is more gas and also she is reporting mucus in her stool.     Current Outpatient Medications  Medication Instructions   Azelaic Acid 15 % gel SMARTSIG:1 sparingly Topical Daily   escitalopram (LEXAPRO) 5 mg, Oral, Daily   levocetirizine (XYZAL) 5 mg, Every evening   nortriptyline (PAMELOR) 25 MG capsule TAKE 1 CAPSULE AT BEDTIME 7 DAYS PER MONTH, START 2 TO 3 DAYS PRIOR TO PERIOD   nortriptyline (PAMELOR) 50 MG capsule TAKE 1 CAPSULE BY MOUTH AT BEDTIME   Qulipta 60 mg, Oral, Daily   rizatriptan (MAXALT) 10 MG tablet TAKE 1 TABLET AT ONSET OF HEADACHE, MAY REPEAT 1 TABLET IN 2 HOURS IF NEEDED    Patient Active Problem List   Diagnosis Date Noted   Generalized anxiety disorder 02/04/2024   Neurocardiogenic syncope 07/24/2017   Migraine without aura  and without status migrainosus, not intractable 07/24/2017      Review of Systems  All other systems reviewed and are negative.     Objective:     BP 98/64   Pulse 85   Temp 98.5 F (36.9 C) (Oral)   Ht 5' (1.524 m)   Wt 108 lb 4.8 oz (49.1 kg)   LMP 02/03/2024 (Exact Date)   SpO2 100%   BMI 21.15 kg/m    Physical Exam Vitals reviewed.  Constitutional:      Appearance: Normal appearance. She is normal weight.  Pulmonary:     Effort: Pulmonary effort is normal.  Neurological:     Mental Status: She is alert and oriented to person, place, and time. Mental status is at baseline.  Psychiatric:        Mood and Affect: Affect normal. Mood is anxious.        Speech: Speech normal.        Behavior: Behavior normal.        Thought Content: Thought content normal.        Cognition and Memory: Cognition and memory normal.        Judgment: Judgment normal.       02/04/2024    9:45 AM  GAD 7 : Generalized Anxiety Score  Nervous, Anxious, on Edge 3  Control/stop worrying 3  Worry too much - different things 3  Trouble relaxing 2  Restless 1  Easily annoyed or irritable 0  Afraid - awful might happen 1  Total GAD 7 Score 13      No results found for any visits on 02/04/24.    The ASCVD Risk score (Arnett DK, et al., 2019) failed to calculate for the following reasons:   The 2019 ASCVD risk score is only valid for ages 60 to 9    Assessment & Plan:  Generalized anxiety disorder Assessment & Plan: GAD score is elevated, I reviewed her medications, she is weaning off the nortriptyline for her migraines and was placed on Qlipta. I believe that the nortriptyline was helping on some level with her anxiety, we discussed other medications and we will start her on lexapro 5 mg daily. I will see her back in 2 months to reassess her symptoms.   Orders: -     Escitalopram Oxalate; Take 1 tablet (5 mg total) by mouth daily.  Dispense: 30 tablet; Refill: 3  Chronic  constipation -     Ambulatory referral to Gastroenterology  Mucus in stool -     Ambulatory referral to Gastroenterology  Will send patient to GI for evaluation due to the abrupt change in her stool pattern, also the mucus in her stool. I thought it might have been from the addition of the Qlipta (causes constipation as a side effect) however pt was very worried about the situation so I will send her. I spent a total of 30 minutes with the patient today providing counseling on medications and counseling on her abdominal symptoms.   Return in about 2 months (around 04/05/2024) for video visit to follow up on anxiety.    Karie Georges, MD

## 2024-03-10 ENCOUNTER — Ambulatory Visit: Payer: BC Managed Care – PPO | Admitting: Neurology

## 2024-03-10 DIAGNOSIS — K5900 Constipation, unspecified: Secondary | ICD-10-CM | POA: Diagnosis not present

## 2024-04-14 ENCOUNTER — Telehealth (INDEPENDENT_AMBULATORY_CARE_PROVIDER_SITE_OTHER): Admitting: Family Medicine

## 2024-04-14 ENCOUNTER — Encounter: Payer: Self-pay | Admitting: Family Medicine

## 2024-04-14 DIAGNOSIS — F411 Generalized anxiety disorder: Secondary | ICD-10-CM

## 2024-04-14 MED ORDER — ESCITALOPRAM OXALATE 10 MG PO TABS: 10.0000 mg | ORAL_TABLET | Freq: Every day | ORAL | 2 refills | Status: DC

## 2024-04-14 NOTE — Progress Notes (Signed)
   Virtual Medical Office Visit  Patient:  Tiffany Barnett      Age: 23 y.o.       Sex:  female  Date:   04/14/2024  PCP:    Aida House, MD   Today's Healthcare Provider: Aida House, MD    Assessment/Plan:   Summary assessment:  Tiffany Barnett was seen today for medical management of chronic issues.  Generalized anxiety disorder -     Escitalopram  Oxalate; Take 1 tablet (10 mg total) by mouth daily.  Dispense: 30 tablet; Refill: 2   Some control of worrying, will increase dose to 10 mg and see her back in 2 months for repeat video visit.   Return in about 2 months (around 06/14/2024) for video visit for follow up on anxiety.   She was advised to call the office or go to ER if her condition worsens    Subjective:   Tiffany Barnett is a 23 y.o. female with PMH significant for: Past Medical History:  Diagnosis Date   Allergy    Frequent headaches    Headache    History of fainting spells of unknown cause    Migraine    Syncope    Vasovagal syncope      Presenting today with: Chief Complaint  Patient presents with   Medical Management of Chronic Issues     She clarifies and reports that her condition: GAD-- pt reports that since she started the lexapro  5 mg daily she has been feeling less worried, but is still having trouble relaxing. States that she is doing more hobbies, cleaning around the house, states that she is not having any side effects from the medication, states she is taking it at night and it is helping her with falling asleep.   She denies having any: Side effects          Objective/Observations  Physical Exam:  Polite and friendly Gen: NAD, resting comfortably Pulm: Normal work of breathing Neuro: Grossly normal, moves all extremities Psych: Normal affect and thought content Problem specific physical exam findings:  N/A  No images are attached to the encounter or orders placed in the encounter.    Results: No  results found for any visits on 04/14/24.   No results found for this or any previous visit (from the past 2160 hours).         Virtual Visit via Video   I connected with Tiffany Barnett on 04/14/24 at 11:00 AM EDT by a video enabled telemedicine application and verified that I am speaking with the correct person using two identifiers. The limitations of evaluation and management by telemedicine and the availability of in person appointments were discussed. The patient expressed understanding and agreed to proceed.   Percentage of appointment time on video:  100% Patient location: Home Provider location: [facility] Persons participating in the virtual visit: Myself and Patient

## 2024-05-04 NOTE — Progress Notes (Unsigned)
 Virtual Visit via Video Note  Consent was obtained for video visit:  Yes.   Answered questions that patient had about telehealth interaction:  Yes.   I discussed the limitations, risks, security and privacy concerns of performing an evaluation and management service by telemedicine. I also discussed with the patient that there may be a patient responsible charge related to this service. The patient expressed understanding and agreed to proceed.  Pt location: Home Physician Location: office Name of referring provider:  Aida House, MD I connected with Tiffany Barnett at patients initiation/request on 05/05/2024 at  9:50 AM EDT by video enabled telemedicine application and verified that I am speaking with the correct person using two identifiers. Pt MRN:  [id number] Pt DOB:  Aug 12, 2001 Video Participants:  Tiffany Barnett    Assessment/Plan:   Menstrual-related migraines without aura, without status migrainosus, not intractable.  Migraine prevention:  Qulipta  60mg  daily Migraine rescue:  rizatriptan  10mg  Limit use of pain relievers to no more than 2 days out of week to prevent risk of rebound or medication-overuse headache. Keep headache diary Follow up in December   Subjective:  Tiffany Barnett is a 23 year old right-handed female with neurocardiogenic syncope who follows up for migraine.   UPDATE: Started Qulipta .  Tapered off nortriptyline . Doing much better. At first she had decreased appetite on Qulipta  but she has adapted.    Averaging one migraine a month, not necessarily during week of menses.  Lasts 20 minutes with rizatriptan .      Current NSAIDS:  none Current analgesics:  none Current triptans:  Rizatriptan  10mg  Current ergotamine:  none Current anti-emetic:  none Current muscle relaxants:  none Current anti-anxiolytic:  none Current sleep aide:  none Current Antihypertensive medications:  none Current Antidepressant medications:    Current Anticonvulsant medications:  none Current anti-CGRP:  Qulipta  60mg  daily Current Vitamins/Herbal/Supplements:  none Current Antihistamines/Decongestants:  Xyzal Other therapy:  none Hormone/birth control:  none    Caffeine:  no Alcohol:  no Smoker:  no Diet:  Hydrates. Exercise:  30 minutes 4 to 5 days a week Depression:  no; Anxiety:  no Other pain:  no Sleep hygiene:  Good.   HISTORY:  Onset: 23 years old.  Started to increase at age 75. Location:  Unilateral retro-orbital, either side Quality:  Pounding/stabbing/pressure Initial intensity:  5/10 Aura:  no Prodrome:  no Postdrome:  no Associated symptoms:  Sometimes  photophobia no nausea, vomiting, phonophobia, osmophobia or visual disturbance.  She has had migraines with dizziness and palpitations. Initial Duration:  3 to 4 hours (sometimes 2 hours with sumatriptan  25mg ) Initial Frequency:  5 days per month Initial Frequency of abortive medication: 5 days per month Triggers: Unknown Relieving factors: None Activity:  Aggravates but still functions.   Past NSAIDS:  Excedrin, ibuprofen Past analgesics:  Tylenol, Excedrin Past abortive triptans:  sumatriptan  50mg  Past muscle relaxants:  tizanidine  Past anti-emetic:  no Past antihypertensive medications:  no Past antidepressant medications:  no Past anticonvulsant medications:  no Past vitamins/Herbal/Supplements:  no Other past therapies:  no   Since childhood, she has had recurrent syncope which has been diagnosed as neurocardiogenic syncope.  She will feel lightheaded with dimming of vision, diaphoresis and palpitations.  She passes out for just a few seconds.  There is no postictal confusion.  It occurs spontaneously and not triggered by change in position or exertion (however one event was associated with injuring her finger).  It first occurred at age 57.  It  occurred again at age 74 and then at age 89.  However, it has been occurring more frequent since age  73.  She had an episode last November and another episode in July.  She would experience a headache about 2 days prior to the syncope.  She does not have headache with the syncope.  There is a question about whether the syncope is related to her migraines.     Family history of headache:  mom   CT of head from 06/03/17 was personally reviewed and revealed no mass lesion or acute intracranial abnormality.  Past Medical History: Past Medical History:  Diagnosis Date   Allergy    Frequent headaches    Headache    History of fainting spells of unknown cause    Migraine    Syncope    Vasovagal syncope     Medications: Outpatient Encounter Medications as of 05/05/2024  Medication Sig   Atogepant  (QULIPTA ) 60 MG TABS Take 1 tablet (60 mg total) by mouth daily.   Azelaic Acid 15 % gel SMARTSIG:1 sparingly Topical Daily   escitalopram  (LEXAPRO ) 10 MG tablet Take 1 tablet (10 mg total) by mouth daily.   levocetirizine (XYZAL) 5 MG tablet Take 5 mg by mouth every evening.   rizatriptan  (MAXALT ) 10 MG tablet TAKE 1 TABLET AT ONSET OF HEADACHE, MAY REPEAT 1 TABLET IN 2 HOURS IF NEEDED   [DISCONTINUED] nortriptyline  (PAMELOR ) 50 MG capsule TAKE 1 CAPSULE BY MOUTH AT BEDTIME   No facility-administered encounter medications on file as of 05/05/2024.    Allergies: Allergies  Allergen Reactions   Other     Dust, mold and pollen cause sinus congestion flare-up    Family History: Family History  Problem Relation Age of Onset   Arthritis Mother    Migraines Mother    Anemia Mother    Arthritis Father    Migraines Father    Early death Maternal Grandfather    Alcohol abuse Maternal Grandfather    Early death Paternal Grandmother    Hearing loss Paternal Grandfather     Observations/Objective:   No acute distress.  Alert and oriented.  Speech fluent and not dysarthric.  Language intact.    Follow Up Instructions:    -I discussed the assessment and treatment plan with the patient. The  patient was provided an opportunity to ask questions and all were answered. The patient agreed with the plan and demonstrated an understanding of the instructions.   The patient was advised to call back or seek an in-person evaluation if the symptoms worsen or if the condition fails to improve as anticipated.   Nathaniel Bald, DO

## 2024-05-05 ENCOUNTER — Encounter: Payer: Self-pay | Admitting: Neurology

## 2024-05-05 ENCOUNTER — Telehealth (INDEPENDENT_AMBULATORY_CARE_PROVIDER_SITE_OTHER): Admitting: Neurology

## 2024-05-05 DIAGNOSIS — G43009 Migraine without aura, not intractable, without status migrainosus: Secondary | ICD-10-CM | POA: Diagnosis not present

## 2024-06-09 DIAGNOSIS — L700 Acne vulgaris: Secondary | ICD-10-CM | POA: Diagnosis not present

## 2024-06-23 ENCOUNTER — Other Ambulatory Visit: Payer: Self-pay | Admitting: Neurology

## 2024-06-29 ENCOUNTER — Other Ambulatory Visit: Payer: Self-pay

## 2024-06-29 MED ORDER — RIZATRIPTAN BENZOATE 10 MG PO TABS: ORAL_TABLET | ORAL | 5 refills | Status: DC

## 2024-06-29 NOTE — Telephone Encounter (Signed)
 Fax received from [facility], New Script request, Patient would like to pickup [facility]riptan  at [facility] now.  New script sent.

## 2024-06-30 DIAGNOSIS — K5900 Constipation, unspecified: Secondary | ICD-10-CM | POA: Diagnosis not present

## 2024-06-30 DIAGNOSIS — R197 Diarrhea, unspecified: Secondary | ICD-10-CM | POA: Diagnosis not present

## 2024-07-20 ENCOUNTER — Other Ambulatory Visit: Payer: Self-pay | Admitting: Family Medicine

## 2024-07-20 DIAGNOSIS — F411 Generalized anxiety disorder: Secondary | ICD-10-CM

## 2024-07-21 ENCOUNTER — Encounter: Payer: Self-pay | Admitting: Family Medicine

## 2024-08-25 ENCOUNTER — Ambulatory Visit (INDEPENDENT_AMBULATORY_CARE_PROVIDER_SITE_OTHER): Payer: BC Managed Care – PPO | Admitting: Family Medicine

## 2024-08-25 VITALS — BP 100/62 | HR 79 | Temp 98.1°F | Ht 61.75 in | Wt 104.1 lb

## 2024-08-25 DIAGNOSIS — Z0000 Encounter for general adult medical examination without abnormal findings: Secondary | ICD-10-CM | POA: Diagnosis not present

## 2024-08-25 DIAGNOSIS — Z13220 Encounter for screening for lipoid disorders: Secondary | ICD-10-CM

## 2024-08-25 DIAGNOSIS — F411 Generalized anxiety disorder: Secondary | ICD-10-CM | POA: Diagnosis not present

## 2024-08-25 LAB — CBC WITH DIFFERENTIAL/PLATELET
Basophils Absolute: 0 K/uL (ref 0.0–0.1)
Basophils Relative: 0.5 % (ref 0.0–3.0)
Eosinophils Absolute: 0 K/uL (ref 0.0–0.7)
Eosinophils Relative: 0.8 % (ref 0.0–5.0)
HCT: 42.4 % (ref 36.0–46.0)
Hemoglobin: 14.1 g/dL (ref 12.0–15.0)
Lymphocytes Relative: 27.4 % (ref 12.0–46.0)
Lymphs Abs: 1.1 K/uL (ref 0.7–4.0)
MCHC: 33.2 g/dL (ref 30.0–36.0)
MCV: 95.4 fl (ref 78.0–100.0)
Monocytes Absolute: 0.3 K/uL (ref 0.1–1.0)
Monocytes Relative: 8 % (ref 3.0–12.0)
Neutro Abs: 2.5 K/uL (ref 1.4–7.7)
Neutrophils Relative %: 63.3 % (ref 43.0–77.0)
Platelets: 228 K/uL (ref 150.0–400.0)
RBC: 4.45 Mil/uL (ref 3.87–5.11)
RDW: 13.6 % (ref 11.5–15.5)
WBC: 3.9 K/uL — ABNORMAL LOW (ref 4.0–10.5)

## 2024-08-25 LAB — LIPID PANEL
Cholesterol: 142 mg/dL (ref 0–200)
HDL: 79.1 mg/dL (ref >39.00)
LDL Cholesterol: 51 mg/dL (ref 0–99)
NonHDL: 62.55
Total CHOL/HDL Ratio: 2
Triglycerides: 60 mg/dL (ref 0.0–149.0)
VLDL: 12 mg/dL (ref 0.0–40.0)

## 2024-08-25 LAB — COMPREHENSIVE METABOLIC PANEL WITH GFR
ALT: 8 U/L (ref 0–35)
AST: 14 U/L (ref 0–37)
Albumin: 4.5 g/dL (ref 3.5–5.2)
Alkaline Phosphatase: 67 U/L (ref 39–117)
BUN: 15 mg/dL (ref 6–23)
CO2: 30 meq/L (ref 19–32)
Calcium: 9.3 mg/dL (ref 8.4–10.5)
Chloride: 102 meq/L (ref 96–112)
Creatinine, Ser: 1.09 mg/dL (ref 0.40–1.20)
GFR: 70.53 mL/min (ref >60.00)
Glucose, Bld: 56 mg/dL — ABNORMAL LOW (ref 70–99)
Potassium: 4.6 meq/L (ref 3.5–5.1)
Sodium: 139 meq/L (ref 135–145)
Total Bilirubin: 0.9 mg/dL (ref 0.2–1.2)
Total Protein: 7 g/dL (ref 6.0–8.3)

## 2024-08-25 LAB — TSH: TSH: 1.71 u[IU]/mL (ref 0.35–5.50)

## 2024-08-25 MED ORDER — BUSPIRONE HCL 5 MG PO TABS: 5.0000 mg | ORAL_TABLET | Freq: Two times a day (BID) | ORAL | 2 refills | Status: DC

## 2024-08-25 NOTE — Assessment & Plan Note (Signed)
 Lexapro  caused side effects, will switch to buspirone BID, pt will let me know in a few weeks how it is working for her.

## 2024-08-25 NOTE — Patient Instructions (Addendum)
 Send me a my chart message in 1 month to give me an update on the buspirone and how it is working.  Health Maintenance, Female Adopting a healthy lifestyle and getting preventive care are important in promoting health and wellness. Ask your health care provider about: The right schedule for you to have regular tests and exams. Things you can do on your own to prevent diseases and keep yourself healthy. What should I know about diet, weight, and exercise? Eat a healthy diet  Eat a diet that includes plenty of vegetables, fruits, low-fat dairy products, and lean protein. Do not eat a lot of foods that are high in solid fats, added sugars, or sodium. Maintain a healthy weight Body mass index (BMI) is used to identify weight problems. It estimates body fat based on height and weight. Your health care provider can help determine your BMI and help you achieve or maintain a healthy weight. Get regular exercise Get regular exercise. This is one of the most important things you can do for your health. Most adults should: Exercise for at least 150 minutes each week. The exercise should increase your heart rate and make you sweat (moderate-intensity exercise). Do strengthening exercises at least twice a week. This is in addition to the moderate-intensity exercise. Spend less time sitting. Even light physical activity can be beneficial. Watch cholesterol and blood lipids Have your blood tested for lipids and cholesterol at 25 years of age, then have this test every 5 years. Have your cholesterol levels checked more often if: Your lipid or cholesterol levels are high. You are older than 25 years of age. You are at high risk for heart disease. What should I know about cancer screening? Depending on your health history and family history, you may need to have cancer screening at various ages. This may include screening for: Breast cancer. Cervical cancer. Colorectal cancer. Skin cancer. Lung  cancer. What should I know about heart disease, diabetes, and high blood pressure? Blood pressure and heart disease High blood pressure causes heart disease and increases the risk of stroke. This is more likely to develop in people who have high blood pressure readings or are overweight. Have your blood pressure checked: Every 3-5 years if you are 64-11 years of age. Every year if you are 25 years old or older. Diabetes Have regular diabetes screenings. This checks your fasting blood sugar level. Have the screening done: Once every three years after age 63 if you are at a normal weight and have a low risk for diabetes. More often and at a younger age if you are overweight or have a high risk for diabetes. What should I know about preventing infection? Hepatitis B If you have a higher risk for hepatitis B, you should be screened for this virus. Talk with your health care provider to find out if you are at risk for hepatitis B infection. Hepatitis C Testing is recommended for: Everyone born from [birth year] through 1965. Anyone with known risk factors for hepatitis C. Sexually transmitted infections (STIs) Get screened for STIs, including gonorrhea and chlamydia, if: You are sexually active and are younger than 25 years of age. You are older than 25 years of age and your health care provider tells you that you are at risk for this type of infection. Your sexual activity has changed since you were last screened, and you are at increased risk for chlamydia or gonorrhea. Ask your health care provider if you are at risk. Ask your health care  provider about whether you are at high risk for HIV. Your health care provider may recommend a prescription medicine to help prevent HIV infection. If you choose to take medicine to prevent HIV, you should first get tested for HIV. You should then be tested every 3 months for as long as you are taking the medicine. Pregnancy If you are about to stop having your  period (premenopausal) and you may become pregnant, seek counseling before you get pregnant. Take 400 to 800 micrograms (mcg) of folic acid every day if you become pregnant. Ask for birth control (contraception) if you want to prevent pregnancy. Osteoporosis and menopause Osteoporosis is a disease in which the bones lose minerals and strength with aging. This can result in bone fractures. If you are 15 years old or older, or if you are at risk for osteoporosis and fractures, ask your health care provider if you should: Be screened for bone loss. Take a calcium or vitamin D supplement to lower your risk of fractures. Be given hormone replacement therapy (HRT) to treat symptoms of menopause. Follow these instructions at home: Alcohol use Do not drink alcohol if: Your health care provider tells you not to drink. You are pregnant, may be pregnant, or are planning to become pregnant. If you drink alcohol: Limit how much you have to: 0-1 drink a day. Know how much alcohol is in your drink. In the [place] one drink equals one 12 oz bottle of beer (355 mL), one 5 oz glass of wine (148 mL), or one 1 oz glass of hard liquor (44 mL). Lifestyle Do not use any products that contain nicotine or tobacco. These products include cigarettes, chewing tobacco, and vaping devices, such as e-cigarettes. If you need help quitting, ask your health care provider. Do not use street drugs. Do not share needles. Ask your health care provider for help if you need support or information about quitting drugs. General instructions Schedule regular health, dental, and eye exams. Stay current with your vaccines. Tell your health care provider if: You often feel depressed. You have ever been abused or do not feel safe at home. Summary Adopting a healthy lifestyle and getting preventive care are important in promoting health and wellness. Follow your health care provider's instructions about healthy diet, exercising, and  getting tested or screened for diseases. Follow your health care provider's instructions on monitoring your cholesterol and blood pressure. This information is not intended to replace advice given to you by your health care provider. Make sure you discuss any questions you have with your health care provider. Document Revised: 04/02/2021 Document Reviewed: 04/02/2021 Elsevier Patient Education  2024 [employer].

## 2024-08-25 NOTE — Progress Notes (Signed)
 Complete physical exam  Patient: Tiffany Barnett   DOB: 2001-08-05   23 y.o. Female  MRN: [id number]  Subjective:    Chief Complaint  Patient presents with   Annual Exam    Tawonna Loveda Colaizzi is a 23 y.o. female who presents today for a complete physical exam. She reports consuming a general diet. Gym/ health club routine includes cardio and light weights. She generally feels well. She reports sleeping fairly well. She does have additional problems to discuss today.   Pt reports that the lexapro  is causing some side effects, making her tired and giving her brain fog. States that it was doing ok however she read that it is associated with glaucoma and she has a family history of glaucoma. We discussed alternative medications and I recommended buspirone BID. Pt is agreeable to trying this medication.   Most recent fall risk assessment:    05/05/2024    9:24 AM  Fall Risk   Falls in the past year? 0  Number falls in past yr: 0  Injury with Fall? 0  Follow up Falls evaluation completed     Most recent depression screenings:    04/14/2024   10:55 AM 02/04/2024    9:44 AM  PHQ 2/9 Scores  PHQ - 2 Score 1 0  PHQ- 9 Score 5 2    Vision:Within last year and Dental: No current dental problems and Receives regular dental care  Patient Active Problem List   Diagnosis Date Noted   Generalized anxiety disorder 02/04/2024   Neurocardiogenic syncope 07/24/2017   Migraine without aura and without status migrainosus, not intractable 07/24/2017      Patient Care Team: Ozell Heron HERO, MD as PCP - General (Family Medicine) Skeet Juliene SAUNDERS, DO as Consulting Physician (Neurology)   Outpatient Medications Prior to Visit  Medication Sig   Atogepant  (QULIPTA ) 60 MG TABS TAKE 1 TABLET BY MOUTH EVERY DAY   Azelaic Acid 15 % gel SMARTSIG:1 sparingly Topical Daily   levocetirizine (XYZAL) 5 MG tablet Take 5 mg by mouth every evening.   rizatriptan  (MAXALT ) 10 MG tablet  TAKE 1 TABLET AT ONSET OF HEADACHE, MAY REPEAT 1 TABLET IN 2 HOURS IF NEEDED   [DISCONTINUED] escitalopram  (LEXAPRO ) 10 MG tablet TAKE 1 TABLET BY MOUTH EVERY DAY   No facility-administered medications prior to visit.    Review of Systems  HENT:  Negative for hearing loss.   Eyes:  Negative for blurred vision.  Respiratory:  Negative for shortness of breath.   Cardiovascular:  Negative for chest pain.  Gastrointestinal: Negative.   Genitourinary: Negative.   Musculoskeletal:  Negative for back pain.  Neurological:  Negative for headaches.  Psychiatric/Behavioral:  Negative for depression.        Objective:     BP 100/62   Pulse 79   Temp 98.1 F (36.7 C) (Oral)   Ht 5' 1.75 (1.568 m)   Wt 104 lb 1.6 oz (47.2 kg)   LMP 08/20/2024 (Exact Date)   SpO2 99%   BMI 19.19 kg/m    Physical Exam Vitals reviewed.  Constitutional:      Appearance: Normal appearance. She is well-groomed and normal weight.  HENT:     Right Ear: Tympanic membrane and ear canal normal.     Left Ear: Tympanic membrane and ear canal normal.     Mouth/Throat:     Mouth: Mucous membranes are moist.     Pharynx: No posterior oropharyngeal erythema.  Eyes:  Conjunctiva/sclera: Conjunctivae normal.  Neck:     Thyroid : No thyromegaly.  Cardiovascular:     Rate and Rhythm: Normal rate and regular rhythm.     Pulses: Normal pulses.     Heart sounds: S1 normal and S2 normal.  Pulmonary:     Effort: Pulmonary effort is normal.     Breath sounds: Normal breath sounds and air entry.  Abdominal:     General: Abdomen is flat. Bowel sounds are normal.     Palpations: Abdomen is soft.  Musculoskeletal:     Right lower leg: No edema.     Left lower leg: No edema.  Lymphadenopathy:     Cervical: No cervical adenopathy.  Neurological:     Mental Status: She is alert and oriented to person, place, and time. Mental status is at baseline.     Gait: Gait is intact.  Psychiatric:        Mood and Affect:  Mood and affect normal.        Speech: Speech normal.        Behavior: Behavior normal.        Judgment: Judgment normal.      No results found for any visits on 08/25/24.     Assessment & Plan:    Routine Health Maintenance and Physical Exam  Immunization History  Administered Date(s) Administered   INFLUENZA, HIGH DOSE SEASONAL PF 03/30/2020, 12/18/2020   Influenza Inj Mdck Quad Pf 08/30/2017   Influenza, Quadrivalent, Recombinant, Inj, Pf 08/16/2019   Influenza, Seasonal, Injecte, Preservative Fre 08/20/2023   Influenza,inj,Quad PF,6+ Mos 09/22/2018, 12/24/2023   Influenza-Unspecified 08/25/2016, 09/10/2018, 08/25/2022    Health Maintenance  Topic Date Due   HPV VACCINES (1 - 3-dose series) Never done   DTaP/Tdap/Td (1 - Tdap) Never done   Hepatitis B Vaccines 19-59 Average Risk (1 of 3 - 19+ 3-dose series) Never done   COVID-19 Vaccine (1 - 2024-25 season) Never done   Influenza Vaccine  02/22/2025 (Originally 06/25/2024)   Hepatitis C Screening  08/25/2025 (Originally 12/28/2016)   HIV Screening  08/25/2025 (Originally 12/28/2013)   Cervical Cancer Screening (Pap smear)  08/19/2026   Pneumococcal Vaccine  Aged Out   Meningococcal B Vaccine  Aged Out    Discussed health benefits of physical activity, and encouraged her to engage in regular exercise appropriate for her age and condition.  Generalized anxiety disorder Assessment & Plan: Lexapro  caused side effects, will switch to buspirone BID, pt will let me know in a few weeks how it is working for her.   Orders: -     busPIRone HCl; Take 1 tablet (5 mg total) by mouth 2 (two) times daily.  Dispense: 60 tablet; Refill: 2 -     Comprehensive metabolic panel with GFR; Future -     CBC with Differential/Platelet; Future -     TSH; Future  Lipid screening -     Lipid panel; Future  Routine general medical examination at a health care facility  General physical exam findings are normal today. I reviewed the patient's  preventative testing, immunizations, and lifestyle habits. I made appropriate recommendations and placed orders for the appropriate tests and/or vaccinations. I counseled the patient on the CDC's recommendations for healthy exercise and diet. I counseled the patient on healthy sleep habits and stress management. Handouts to reinforce the counseling were given at the conclusion of the visit.    Return in 1 year (on 08/25/2025).     Heron CHRISTELLA Sharper, MD

## 2024-08-31 ENCOUNTER — Ambulatory Visit: Payer: Self-pay | Admitting: Family Medicine

## 2024-09-08 ENCOUNTER — Telehealth: Payer: Self-pay | Admitting: Pharmacy Technician

## 2024-09-08 ENCOUNTER — Other Ambulatory Visit (HOSPITAL_COMMUNITY): Payer: Self-pay

## 2024-09-08 NOTE — Telephone Encounter (Signed)
 Pharmacy Patient Advocate Encounter   Received notification from Fax that prior authorization for QULIPTA  60MG  is required/requested.   Insurance verification completed.   The patient is insured through [employer].   Per test claim: PA required; PA submitted to above mentioned insurance via [employer]/confirmation #/EOC [id number] P Status is pending

## 2024-09-08 NOTE — Telephone Encounter (Signed)
 Pharmacy Patient Advocate Encounter  Received notification from [facility] that Prior Authorization for QULIPTA  60MG  has been APPROVED from 9.15.25 to 10.15.26. Ran test claim, Copay is $0. This test claim was processed through [facility]- copay amounts may vary at other pharmacies due to pharmacy/plan contracts, or as the patient moves through the different stages of their insurance plan.   PA #/Case ID/Reference #: [id number]

## 2024-09-16 ENCOUNTER — Other Ambulatory Visit: Payer: Self-pay | Admitting: Family Medicine

## 2024-09-16 DIAGNOSIS — F411 Generalized anxiety disorder: Secondary | ICD-10-CM

## 2024-10-11 ENCOUNTER — Other Ambulatory Visit (HOSPITAL_COMMUNITY): Payer: Self-pay

## 2024-11-02 NOTE — Progress Notes (Unsigned)
 NEUROLOGY FOLLOW UP OFFICE NOTE  Tiffany Barnett [id number]  Assessment/Plan:   Menstrual-related migraines without aura, without status migrainosus, not intractable.   Migraine prevention:  Qulipta  60mg  daily Migraine rescue:  rizatriptan  10mg  Lifestyle modification: Limit use of pain relievers to no more than 9 days out of the month to prevent risk of rebound or medication-overuse headache. Diet modification/hydration/caffeine cessation Routine exercise Sleep hygiene Consider vitamins/supplements:  magnesium citrate 400mg  daily, riboflavin 400mg  daily, CoQ10 100mg  three times daily Keep headache diary Follow up ***    Subjective:  Tiffany Barnett is a 23 year old right-handed female with neurocardiogenic syncope who follows up for migraine.   UPDATE: Intensity:  *** Duration:  20 minutes with rizatriptan  Frequency:  averages 1 a month   Current NSAIDS:  none Current analgesics:  none Current triptans:  Rizatriptan  10mg  Current ergotamine:  none Current anti-emetic:  none Current muscle relaxants:  none Current anti-anxiolytic:  none Current sleep aide:  none Current Antihypertensive medications:  none Current Antidepressant medications:   Current Anticonvulsant medications:  none Current anti-CGRP:  Qulipta  60mg  daily Current Vitamins/Herbal/Supplements:  none Current Antihistamines/Decongestants:  Xyzal Other therapy:  none Hormone/birth control:  none    Caffeine:  no Alcohol:  no Smoker:  no Diet:  Hydrates. Exercise:  30 minutes 4 to 5 days a week Depression:  no; Anxiety:  no Other pain:  no Sleep hygiene:  Good.   HISTORY:  Onset: 23 years old.  Started to increase at age 63. Location:  Unilateral retro-orbital, either side Quality:  Pounding/stabbing/pressure Initial intensity:  5/10 Aura:  no Prodrome:  no Postdrome:  no Associated symptoms:  Sometimes  photophobia no nausea, vomiting, phonophobia, osmophobia or visual  disturbance.  She has had migraines with dizziness and palpitations. Initial Duration:  3 to 4 hours (sometimes 2 hours with sumatriptan  25mg ) Initial Frequency:  5 days per month Initial Frequency of abortive medication: 5 days per month Triggers: Unknown Relieving factors: None Activity:  Aggravates but still functions.   Past NSAIDS:  Excedrin, ibuprofen Past analgesics:  Tylenol, Excedrin Past abortive triptans:  sumatriptan  50mg  Past muscle relaxants:  tizanidine  Past anti-emetic:  no Past antihypertensive medications:  no Past antidepressant medications:  no Past anticonvulsant medications:  no Past vitamins/Herbal/Supplements:  no Other past therapies:  no   Since childhood, she has had recurrent syncope which has been diagnosed as neurocardiogenic syncope.  She will feel lightheaded with dimming of vision, diaphoresis and palpitations.  She passes out for just a few seconds.  There is no postictal confusion.  It occurs spontaneously and not triggered by change in position or exertion (however one event was associated with injuring her finger).  It first occurred at age 43.  It occurred again at age 52 and then at age 82.  However, it has been occurring more frequent since age 68.  She had an episode last November and another episode in July.  She would experience a headache about 2 days prior to the syncope.  She does not have headache with the syncope.  There is a question about whether the syncope is related to her migraines.     Family history of headache:  mom   CT of head from 06/03/17 was personally reviewed and revealed no mass lesion or acute intracranial abnormality.  PAST MEDICAL HISTORY: Past Medical History:  Diagnosis Date   Allergy    Frequent headaches    Headache    History of fainting spells of unknown cause  Migraine    Syncope    Vasovagal syncope     MEDICATIONS: Current Outpatient Medications on File Prior to Visit  Medication Sig Dispense Refill    Atogepant  (QULIPTA ) 60 MG TABS TAKE 1 TABLET BY MOUTH EVERY DAY 90 tablet 0   Azelaic Acid 15 % gel SMARTSIG:1 sparingly Topical Daily     busPIRone  (BUSPAR ) 5 MG tablet TAKE 1 TABLET BY MOUTH TWICE A DAY 180 tablet 0   levocetirizine (XYZAL) 5 MG tablet Take 5 mg by mouth every evening.  4   rizatriptan  (MAXALT ) 10 MG tablet TAKE 1 TABLET AT ONSET OF HEADACHE, MAY REPEAT 1 TABLET IN 2 HOURS IF NEEDED 10 tablet 5   No current facility-administered medications on file prior to visit.    ALLERGIES: Allergies  Allergen Reactions   Other     Dust, mold and pollen cause sinus congestion flare-up    FAMILY HISTORY: Family History  Problem Relation Age of Onset   Arthritis Mother    Migraines Mother    Anemia Mother    Arthritis Father    Migraines Father    Early death Maternal Grandfather    Alcohol abuse Maternal Grandfather    Early death Paternal Grandmother    Hearing loss Paternal Grandfather       Objective:  *** General: No acute distress.  Patient appears ***-groomed.   Head:  Normocephalic/atraumatic Eyes:  Fundi examined but not visualized Neck: supple, no paraspinal tenderness, full range of motion Heart:  Regular rate and rhythm Neurological Exam: alert and oriented.  Speech fluent and not dysarthric, language intact.  CN II-XII intact. Bulk and tone normal, muscle strength 5/5 throughout.  Sensation to light touch intact.  Deep tendon reflexes 2+ throughout, toes downgoing.  Finger to nose testing intact.  Gait normal, Romberg negative.   Juliene Dunnings, DO  CC: ***

## 2024-11-03 ENCOUNTER — Ambulatory Visit: Admitting: Neurology

## 2024-11-03 ENCOUNTER — Encounter: Payer: Self-pay | Admitting: Neurology

## 2024-11-03 VITALS — BP 119/74 | HR 102 | Ht 60.0 in | Wt 104.0 lb

## 2024-11-03 DIAGNOSIS — G43009 Migraine without aura, not intractable, without status migrainosus: Secondary | ICD-10-CM | POA: Diagnosis not present

## 2024-11-03 MED ORDER — RIZATRIPTAN BENZOATE 10 MG PO TABS: ORAL_TABLET | ORAL | 11 refills | Status: AC

## 2024-11-03 MED ORDER — QULIPTA 60 MG PO TABS: 1.0000 | ORAL_TABLET | Freq: Every day | ORAL | 3 refills | Status: AC

## 2024-11-03 NOTE — Patient Instructions (Signed)
°  QULIPTA  60MG  DAILY Take RIZATRIPTAN  at earliest onset of headache.  May repeat dose once in 2 hours if needed.  Maximum 2 tablets in 24 hours. Limit use of pain relievers to no more than 9 days out of the month.  These medications include acetaminophen, NSAIDs (ibuprofen/Advil/Motrin, naproxen/Aleve, triptans (Imitrex /sumatriptan ), Excedrin, and narcotics.  This will help reduce risk of rebound headaches. Be aware of common food triggers:  - Caffeine:  coffee, black tea, cola, Mt. Dew  - Chocolate  - Dairy:  aged cheeses (brie, blue, cheddar, gouda, Parmasan, provolone, romano, Swiss, etc), chocolate milk, buttermilk, sour cream, limit eggs and yogurt  - Nuts, peanut butter  - Alcohol  - Cereals/grains:  FRESH breads (fresh bagels, sourdough, doughnuts), yeast productions  - Processed/canned/aged/cured meats (pre-packaged deli meats, hotdogs)  - MSG/glutamate:  soy sauce, flavor enhancer, pickled/preserved/marinated foods  - Sweeteners:  aspartame (Equal, Nutrasweet).  Sugar and Splenda are okay  - Vegetables:  legumes (lima beans, lentils, snow peas, fava beans, pinto peans, peas, garbanzo beans), sauerkraut, onions, olives, pickles  - Fruit:  avocados, bananas, citrus fruit (orange, lemon, grapefruit), mango  - Other:  Frozen meals, macaroni and cheese Routine exercise Stay adequately hydrated (aim for 64 oz water daily) Keep headache diary Maintain proper stress management Maintain proper sleep hygiene Do not skip meals Consider supplements:  magnesium citrate 400mg  daily, riboflavin 400mg  daily, coenzyme Q10 100mg  three times daily.

## 2024-11-26 ENCOUNTER — Encounter: Payer: Self-pay | Admitting: Family Medicine

## 2024-11-29 ENCOUNTER — Ambulatory Visit: Admitting: Family Medicine

## 2024-11-29 ENCOUNTER — Encounter: Payer: Self-pay | Admitting: Family Medicine

## 2024-11-29 ENCOUNTER — Other Ambulatory Visit (HOSPITAL_COMMUNITY)
Admission: RE | Admit: 2024-11-29 | Discharge: 2024-11-29 | Disposition: A | Discharge status: Home / Self Care | Source: Ambulatory Visit | Attending: Family Medicine | Admitting: Family Medicine

## 2024-11-29 VITALS — BP 128/80 | HR 95 | Temp 98.6°F | Ht 60.0 in | Wt 102.2 lb

## 2024-11-29 DIAGNOSIS — N930 Postcoital and contact bleeding: Secondary | ICD-10-CM | POA: Diagnosis not present

## 2024-11-29 NOTE — Progress Notes (Signed)
 "  Established Patient Office Visit  Subjective   Patient ID: Tiffany Barnett, female    DOB: Mar 29, 2001  Age: 24 y.o. MRN: [id number]  Chief Complaint  Patient presents with   Vaginal Bleeding    After intercourse x3 years    Vaginal Bleeding  Discussed the use of AI scribe software for clinical note transcription with the patient, who gave verbal consent to proceed.  History of Present Illness   Tiffany Barnett is a 24 year old female who presents with postcoital bleeding.  She reports postcoital bleeding that ranges from light to heavy but remains lighter than a menstrual period. Bleeding starts bright red immediately after intercourse, then turns brown and lasts about five days. She denies pain with intercourse, fever, chills, or unusual vaginal discharge, though she has previously limited certain movements during sex due to pain. Her last Pap smear was normal and HPV testing was negative.  She notes that her periods now last about seven days, after previously being longer during the COVID pandemic. She occasionally has pain around her belly button.  She uses condoms for contraception and denies latex allergy. She notes that the bleeding occurs even with unprotected intercourse.        Current Outpatient Medications  Medication Instructions   Azelaic Acid 15 % gel SMARTSIG:1 sparingly Topical Daily   busPIRone  (BUSPAR ) 5 mg, Oral, 2 times daily   levocetirizine (XYZAL) 5 mg, Every evening   Qulipta  60 mg, Oral, Daily   rizatriptan  (MAXALT ) 10 MG tablet TAKE 1 TABLET AT ONSET OF HEADACHE, MAY REPEAT 1 TABLET IN 2 HOURS IF NEEDED    Patient Active Problem List   Diagnosis Date Noted   Generalized anxiety disorder 02/04/2024   Neurocardiogenic syncope 07/24/2017   Migraine without aura and without status migrainosus, not intractable 07/24/2017     Review of Systems  Genitourinary:  Positive for vaginal bleeding.      Objective:     BP 128/80    Pulse 95   Temp 98.6 F (37 C) (Oral)   Ht 5' (1.524 m)   Wt 102 lb 3.2 oz (46.4 kg)   LMP 11/12/2024 (Exact Date)   SpO2 98%   BMI 19.96 kg/m    Physical Exam Vitals reviewed. Exam conducted with a chaperone present.  Constitutional:      Appearance: Normal appearance. She is normal weight.  Genitourinary:    Exam position: Lithotomy position.     Labia:        Right: No rash or lesion.        Left: No rash or lesion.      Vagina: Normal.     Cervix: Friability present. No discharge, lesion or cervical bleeding.  Neurological:     Mental Status: She is alert.      No results found for any visits on 11/29/24.    The ASCVD Risk score (Arnett DK, et al., 2019) failed to calculate for the following reasons:   The 2019 ASCVD risk score is only valid for ages 34 to 47   * - Cholesterol units were assumed    Assessment & Plan:  Bleeding after intercourse -     Cervicovaginal ancillary only  Assessment and Plan    Postcoital bleeding Chronic postcoital bleeding for several years, occurring after intercourse, lasting about five days with variable flow. No associated pain during intercourse. Differential diagnosis includes cervical friability, endocervical polyps, fibroids, ovarian cysts, or endometrial hyperplasia. Bright red blood initially, followed by  brown discharge, suggesting possible uterine involvement. No current bleeding during examination. - Performed vaginal swab to rule out STIs including gonorrhea, chlamydia, trichomonas, BV, and yeast. - Conducted speculum exam to assess for cervical polyps or other abnormalities. - If swab is negative, will order pelvic ultrasound to evaluate for fibroids, ovarian cysts, or endometrial hyperplasia. - If ultrasound indicates significant findings, will refer to gynecology for further evaluation and management.  Cervicitis Inflammation of the cervix observed during examination. Possible causes include yeast overgrowth or  bacterial vaginosis. No current bleeding during examination. - Performed vaginal swab to identify potential infectious causes such as yeast or BV. - If swab is positive for yeast, will prescribe fluconazole. - If swab is positive for BV, will prescribe metronidazole. - If swab is negative and symptoms persist, will consider further evaluation with ultrasound.        No follow-ups on file.    Heron CHRISTELLA Sharper, MD "

## 2024-12-01 ENCOUNTER — Ambulatory Visit: Payer: Self-pay | Admitting: Family Medicine

## 2024-12-01 DIAGNOSIS — N930 Postcoital and contact bleeding: Secondary | ICD-10-CM

## 2024-12-01 LAB — CERVICOVAGINAL ANCILLARY ONLY
Bacterial Vaginitis (gardnerella): NEGATIVE
Candida Glabrata: NEGATIVE
Candida Vaginitis: NEGATIVE
Comment: NEGATIVE
Comment: NEGATIVE
Comment: NEGATIVE

## 2024-12-08 ENCOUNTER — Other Ambulatory Visit

## 2024-12-15 ENCOUNTER — Encounter: Payer: Self-pay | Admitting: Podiatry

## 2024-12-15 ENCOUNTER — Ambulatory Visit (INDEPENDENT_AMBULATORY_CARE_PROVIDER_SITE_OTHER): Payer: Self-pay | Admitting: Podiatry

## 2024-12-15 DIAGNOSIS — M79675 Pain in left toe(s): Secondary | ICD-10-CM | POA: Diagnosis not present

## 2024-12-15 DIAGNOSIS — L600 Ingrowing nail: Secondary | ICD-10-CM | POA: Diagnosis not present

## 2024-12-15 NOTE — Progress Notes (Signed)
 Patient complains of painful ingrown lateral border hallux left.  Gets red blood along the nail border for the past several years.  Nail gets ingrown.. Patient denies fevers, chills, nausea, vomiting.  Objective:  Vitals: Reviewed  General: Well developed, nourished, in no acute distress, alert and oriented x3   Vascular: DP pulse 2/4 bilateral. PT pulse 2/4 bilateral.  Mild edema toe with ingrown nail.  Capillary refill time immediate bilaterally  Dermatology: Erythema, edema, incurvated nail border lateral border hallux left with nail drainage .  Dried blood along the nail and into the lateral nail fold.  Tenderness present with palpation. Normal skin tone and texture feet with normal hair growth.  Neurological: Grossly intact. Normal reflexes.   Musculoskeletal: Tenderness with palpation of the distal hallux left. No tenderness or painful ROM at IPJ.  Diagnosis: 1.  Pain left great toe.  2.  Ingrown nail lateral border hallux nail left  Plan: -New patient office visit for evaluation and management level 3.  Modifier 25. -discussed etiology and treatment of ingrown nails. Discussed surgical vs conservative treatment.  Discussed pain foot and treatment plan. -Consent signed for appropriate matrixectomy affected nail(s).   Procedure(s):   - Matrixectomy(s) lateral border hallux nail left: Toe anesthetized with 3cc 2:1 mixture 2% Lidocaine with epinephrine: Sodium Bicarbonate. Surgical site prepped. Digital tourniquet applied.  Avulsion of nail border. performed. Matrixecomy performed with three 30 second applications of phenol to nail matrix. Site irrigated with alcohol.  Tourniquet released with good vascularity noticed in digit.  Applied triple antibiotic to nailbed and applied gauze and Coban dressing. - Written and oral postoperative instructions given.  -Return for post-op 2 weeks.  JINNY Prentice Binder, DPM

## 2024-12-15 NOTE — Patient Instructions (Signed)

## 2024-12-16 ENCOUNTER — Encounter: Payer: Self-pay | Admitting: Podiatry

## 2024-12-17 NOTE — Telephone Encounter (Signed)
 lft mess on vmail to confirm dates she needs for work. I adv to call me and I will email the note to [facility] on file. I will send mess MyChart as well

## 2024-12-21 ENCOUNTER — Encounter: Payer: Self-pay | Admitting: Podiatry

## 2024-12-21 NOTE — Telephone Encounter (Signed)
 See prev notes  Note emailed to pt via her request to be out of work 12/16/24-12/20/24

## 2024-12-29 ENCOUNTER — Ambulatory Visit (INDEPENDENT_AMBULATORY_CARE_PROVIDER_SITE_OTHER): Admitting: Podiatry

## 2024-12-29 DIAGNOSIS — L600 Ingrowing nail: Secondary | ICD-10-CM

## 2024-12-29 NOTE — Progress Notes (Signed)
 Patient presents follow-up nail surgery toe.  No complaints.  Physical exam:  Dermatologic: Nail surgery site for matrixectomy healing well with no signs of infection.  Diagnosis: 1.  Status post matrixectomy toe.  Healing well  Plan: - POV status post nail surgery , if patient has any problems she can call for appointment otherwise we can see her as needed  - Return as needed

## 2025-11-02 ENCOUNTER — Ambulatory Visit: Admitting: Neurology
